# Patient Record
Sex: Female | Born: 1985 | Race: Black or African American | Hispanic: No | Marital: Single | Smoking: Current some day smoker
Health system: Southern US, Community
[De-identification: ages and names within clinical notes are randomized; demographics above are authoritative.]

## PROBLEM LIST (undated history)

## (undated) HISTORY — PX: OTHER SURGICAL HISTORY: SHX169

---
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
## 2005-05-10 ENCOUNTER — Emergency Department: Payer: Self-pay | Admitting: Emergency Medicine

## 2006-04-29 ENCOUNTER — Emergency Department: Payer: Self-pay | Admitting: Unknown Physician Specialty

## 2007-08-27 ENCOUNTER — Ambulatory Visit: Payer: Self-pay | Admitting: Family Medicine

## 2007-12-28 ENCOUNTER — Inpatient Hospital Stay: Payer: Self-pay

## 2008-02-05 ENCOUNTER — Emergency Department (HOSPITAL_COMMUNITY): Admission: EM | Admit: 2008-02-05 | Discharge: 2008-02-05 | Payer: Self-pay | Admitting: Emergency Medicine

## 2008-02-06 ENCOUNTER — Emergency Department (HOSPITAL_COMMUNITY): Admission: EM | Admit: 2008-02-06 | Discharge: 2008-02-06 | Payer: Self-pay | Admitting: *Deleted

## 2008-02-16 ENCOUNTER — Emergency Department (HOSPITAL_COMMUNITY): Admission: EM | Admit: 2008-02-16 | Discharge: 2008-02-16 | Payer: Self-pay | Admitting: Emergency Medicine

## 2008-10-22 ENCOUNTER — Inpatient Hospital Stay: Payer: Self-pay | Admitting: Obstetrics and Gynecology

## 2010-05-28 ENCOUNTER — Emergency Department: Payer: Self-pay | Admitting: Emergency Medicine

## 2010-06-08 ENCOUNTER — Encounter: Payer: Self-pay | Admitting: Maternal & Fetal Medicine

## 2010-07-13 ENCOUNTER — Encounter: Payer: Self-pay | Admitting: Obstetrics and Gynecology

## 2010-07-20 ENCOUNTER — Encounter: Payer: Self-pay | Admitting: Obstetrics and Gynecology

## 2010-07-27 ENCOUNTER — Encounter: Payer: Self-pay | Admitting: Obstetrics & Gynecology

## 2010-07-29 ENCOUNTER — Inpatient Hospital Stay: Payer: Self-pay

## 2011-03-04 ENCOUNTER — Emergency Department: Payer: Self-pay | Admitting: Unknown Physician Specialty

## 2011-10-06 ENCOUNTER — Emergency Department: Payer: Self-pay | Admitting: *Deleted

## 2011-10-06 LAB — URINALYSIS, COMPLETE
Glucose,UR: NEGATIVE mg/dL (ref 0–75)
Ketone: NEGATIVE
Nitrite: NEGATIVE
Specific Gravity: 1.025 (ref 1.003–1.030)
Squamous Epithelial: 11
WBC UR: 1292 /HPF (ref 0–5)

## 2011-11-29 ENCOUNTER — Emergency Department: Payer: Self-pay | Admitting: Emergency Medicine

## 2011-11-30 LAB — HCG, QUANTITATIVE, PREGNANCY: Beta Hcg, Quant.: 10959 m[IU]/mL — ABNORMAL HIGH

## 2011-12-27 ENCOUNTER — Encounter: Payer: Self-pay | Admitting: Obstetrics and Gynecology

## 2012-01-14 ENCOUNTER — Encounter: Payer: Self-pay | Admitting: Maternal & Fetal Medicine

## 2012-03-03 ENCOUNTER — Encounter: Payer: Self-pay | Admitting: Maternal & Fetal Medicine

## 2012-03-12 ENCOUNTER — Ambulatory Visit: Payer: Self-pay | Admitting: Obstetrics and Gynecology

## 2012-03-12 LAB — CBC WITH DIFFERENTIAL/PLATELET
Basophil #: 0.1 10*3/uL (ref 0.0–0.1)
Basophil %: 0.4 %
Eosinophil #: 0.2 10*3/uL (ref 0.0–0.7)
HCT: 35 % (ref 35.0–47.0)
Lymphocyte %: 19.2 %
MCH: 27 pg (ref 26.0–34.0)
MCHC: 32.3 g/dL (ref 32.0–36.0)
MCV: 84 fL (ref 80–100)
Monocyte #: 0.4 x10 3/mm (ref 0.2–0.9)
Neutrophil #: 10.4 10*3/uL — ABNORMAL HIGH (ref 1.4–6.5)
RDW: 13.4 % (ref 11.5–14.5)
WBC: 13.6 10*3/uL — ABNORMAL HIGH (ref 3.6–11.0)

## 2012-03-13 ENCOUNTER — Inpatient Hospital Stay: Payer: Self-pay | Admitting: Obstetrics and Gynecology

## 2012-03-16 LAB — PLATELET COUNT: Platelet: 271 10*3/uL (ref 150–440)

## 2012-05-22 ENCOUNTER — Emergency Department: Payer: Self-pay | Admitting: Emergency Medicine

## 2013-04-24 ENCOUNTER — Emergency Department: Payer: Self-pay | Admitting: Internal Medicine

## 2013-04-25 LAB — URINALYSIS, COMPLETE
Bacteria: NONE SEEN
Ketone: NEGATIVE
Leukocyte Esterase: NEGATIVE
Nitrite: NEGATIVE
Ph: 5 (ref 4.5–8.0)
Protein: NEGATIVE

## 2013-05-07 ENCOUNTER — Emergency Department: Payer: Self-pay | Admitting: Emergency Medicine

## 2013-05-07 LAB — COMPREHENSIVE METABOLIC PANEL
Bilirubin,Total: 0.2 mg/dL (ref 0.2–1.0)
Calcium, Total: 9 mg/dL (ref 8.5–10.1)
Co2: 29 mmol/L (ref 21–32)
Creatinine: 0.82 mg/dL (ref 0.60–1.30)
EGFR (Non-African Amer.): >60
Glucose: 104 mg/dL — ABNORMAL HIGH (ref 65–99)
SGOT(AST): 19 U/L (ref 15–37)
Sodium: 137 mmol/L (ref 136–145)

## 2013-05-07 LAB — CBC WITH DIFFERENTIAL/PLATELET
Basophil #: 0.1 10*3/uL (ref 0.0–0.1)
Eosinophil #: 0.2 10*3/uL (ref 0.0–0.7)
HGB: 12.6 g/dL (ref 12.0–16.0)
Lymphocyte #: 3.2 10*3/uL (ref 1.0–3.6)
Lymphocyte %: 30.4 %
MCH: 27.4 pg (ref 26.0–34.0)
MCV: 84 fL (ref 80–100)
Monocyte %: 4.3 %
Neutrophil #: 6.6 10*3/uL — ABNORMAL HIGH (ref 1.4–6.5)
Neutrophil %: 62.5 %
RBC: 4.59 10*6/uL (ref 3.80–5.20)
RDW: 14.9 % — ABNORMAL HIGH (ref 11.5–14.5)
WBC: 10.5 10*3/uL (ref 3.6–11.0)

## 2013-05-07 LAB — URINALYSIS, COMPLETE
Bacteria: NONE SEEN
Glucose,UR: NEGATIVE mg/dL (ref 0–75)
Ketone: NEGATIVE
Protein: 30
Specific Gravity: 1.023 (ref 1.003–1.030)
Squamous Epithelial: 8

## 2013-10-02 ENCOUNTER — Emergency Department: Payer: Self-pay | Admitting: Emergency Medicine

## 2013-10-04 LAB — BETA STREP CULTURE(ARMC)

## 2014-01-12 ENCOUNTER — Emergency Department: Payer: Self-pay | Admitting: Emergency Medicine

## 2014-02-22 ENCOUNTER — Emergency Department: Payer: Self-pay | Admitting: Emergency Medicine

## 2014-06-23 ENCOUNTER — Emergency Department: Payer: Self-pay | Admitting: Emergency Medicine

## 2014-09-07 NOTE — Op Note (Signed)
PATIENT NAME:  Stephanie Ballard, Stephanie Ballard MR#:  [id number] DATE OF BIRTH:  Aug 24, 1985  DATE OF PROCEDURE:  03/13/2012  PREOPERATIVE DIAGNOSES:  1. Term intrauterine pregnancy at 38 weeks, 5 days gestation by poor dating via 23 week ultrasound.  2. Undesired fertility.  3. History of prior classical Cesarean section.   POSTOPERATIVE DIAGNOSES:  1. Term intrauterine pregnancy at 38 weeks, 5 days gestation by poor dating via 23 week ultrasound.  2. Undesired fertility.  3. History of prior classical Cesarean section.   OPERATION PERFORMED:  1. Low transverse Cesarean section via Pfannenstiel skin incision.  2. Bilateral tubal ligation via Pomeroy method.   ANESTHESIA USED: Spinal.   PRIMARY SURGEON: Florina Ou. Bonney Aid, MD    ASSISTANT: Senaida Lange, MD   ESTIMATED BLOOD LOSS: 800 mL.  OPERATIVE FLUIDS: 800 mL of Crystalloid.   URINE OUTPUT: 100 mL.  DRAINS OR TUBES: On-Q catheter system and Wound VAC.   IMPLANTS: None.   SPECIMENS REMOVED: None. Bilateral portions of right and left tube.   PREOPERATIVE ANTIBIOTIC: 3 grams of Ancef.   COMPLICATIONS: None.   INTRAOPERATIVE FINDINGS: Normal uterus, ovaries, and tubes. Moderate amount of adhesive disease involving the omentum and the prior classical Cesarean section scar. Liveborn female infant weighing 2900 grams, Apgars 9 and 9.   PATIENT CONDITION FOLLOWING PROCEDURE: Stable.   PROCEDURE IN DETAIL: Risks, benefits, and alternatives of the procedure were discussed with the patient prior to proceeding to the operating room. The patient was taken to the operating room where spinal anesthesia was administered. She was positioned in the supine position. The pannus was taped cephalad using tensoplast tape. The abdomen was then prepped and draped in the usual sterile fashion. A Pfannenstiel skin incision was made on the skin utilizing the patient's pre-existing scar. This was carried down sharply to the level of the rectus fascia which was  incised in the midline using the knife. The fascial incision was extended using Mayo scissors. The superior border of the rectus fascia was grasped with two Kocher clamps. The underlying rectus muscles were bluntly dissected off the rectus fascia and the median raphe incised using Mayo scissors. The inferior border of the rectus fascia was dissected off the rectus muscle in a similar fashion. The rectus muscles were divided in the midline. A hemostat was used to tent up the peritoneum which was then incised using Metzenbaum scissors. Peritoneal incision was extended using manual traction. There was a thick omental adhesion to the prior classical hysterotomy scar. This adhesion was taken with two Kelly clamps and doubly ligated using free ties. Bladder flap was unable to be created. Hysterotomy incision was made low transverse on the uterus. This was then entered bluntly using the operator's finger. The hysterotomy incision was extended using manual traction. Upon placing the operator's hand into the hysterotomy incision, infant was noted to be in OA position. Vertex was grasped, flexed, brought to the incision, and delivered atraumatically using fundal pressure. Infant was suctioned. Cord was clamped and cut and the infant was passed to the awaiting pediatrician. Cord blood was obtained. The hysterotomy incision was closed using a single layer closure of 0 Vicryl.   Attention was then turned to the right tube which was ligated using a Pomeroy method. It was doubly ligated using a 0 chromic wheal. The intervening knuckle of tube was then excised. This was repeated on the patient's left tube. Both tubes were noted to be hemostatic as was the hysterotomy incision.   The pelvis was irrigated.  The uterus was returned to the abdomen. Hysterotomy incision was once again inspected and noted to be hemostatic. Muscle was closed using a single mattress suture of 2-0 Vicryl. The On-Q catheter system was then placed 4 cm  superior to the Pfannenstiel skin incision in the midline. The On-Q catheters were threaded through the introducers and introducers were removed. Fascia was closed using a looped #1 PDS in a running fashion. Subcutaneous tissue was irrigated. Hemostasis was achieved using the Bovie. The subcutaneous dead space was closed using a 2-0 chromic on a large needle. The skin was closed using staples. The On-Q catheters were dressed with Dermabond, Steri-Strips, and Tegaderm. Each On-Q catheter was flushed with 5 mL 0.5% bupivacaine each. The Wound VAC was then applied across the incision. Sponge, needle, and instrument counts were correct x2. The patient tolerated the procedure well and was taken to the recovery room in stable condition.    ____________________________ Florina OuAndreas M. Bonney AidStaebler, MD ams:drc D: 03/16/2012 22:32:00 ET T: 03/17/2012 08:49:58 ET JOB#: [id number]  cc: Florina OuAndreas M. Bonney AidStaebler, MD, <Dictator> Lorrene ReidANDREAS M Lyllie Cobbins MD ELECTRONICALLY SIGNED 03/20/2012 23:32

## 2015-10-11 ENCOUNTER — Encounter: Payer: Self-pay | Admitting: Emergency Medicine

## 2015-10-11 ENCOUNTER — Emergency Department
Admission: EM | Admit: 2015-10-11 | Discharge: 2015-10-11 | Disposition: A | Discharge status: Home / Self Care | Payer: Medicaid Other | Attending: Emergency Medicine | Admitting: Emergency Medicine

## 2015-10-11 DIAGNOSIS — N390 Urinary tract infection, site not specified: Secondary | ICD-10-CM | POA: Diagnosis not present

## 2015-10-11 DIAGNOSIS — F17200 Nicotine dependence, unspecified, uncomplicated: Secondary | ICD-10-CM | POA: Diagnosis not present

## 2015-10-11 DIAGNOSIS — R300 Dysuria: Secondary | ICD-10-CM | POA: Diagnosis present

## 2015-10-11 LAB — URINALYSIS COMPLETE WITH MICROSCOPIC (ARMC ONLY)
BACTERIA UA: NONE SEEN
Bilirubin Urine: NEGATIVE
GLUCOSE, UA: NEGATIVE mg/dL
Ketones, ur: NEGATIVE mg/dL
Nitrite: NEGATIVE
Protein, ur: 30 mg/dL — AB
Specific Gravity, Urine: 1.023 (ref 1.005–1.030)
pH: 6 (ref 5.0–8.0)

## 2015-10-11 LAB — GLUCOSE, CAPILLARY: GLUCOSE-CAPILLARY: 119 mg/dL — AB (ref 65–99)

## 2015-10-11 LAB — POCT PREGNANCY, URINE: PREG TEST UR: NEGATIVE

## 2015-10-11 MED ORDER — PHENAZOPYRIDINE HCL 100 MG PO TABS: 100.0000 mg | ORAL_TABLET | Freq: Three times a day (TID) | ORAL | Status: DC | PRN

## 2015-10-11 MED ORDER — SULFAMETHOXAZOLE-TRIMETHOPRIM 800-160 MG PO TABS: 1.0000 | ORAL_TABLET | Freq: Two times a day (BID) | ORAL | Status: DC

## 2015-10-11 NOTE — ED Notes (Signed)
CBG 119 

## 2015-10-11 NOTE — ED Notes (Signed)
Patient ambulatory to triage with steady gait, without difficulty or distress noted; pt reports dysuria for last few days; denies abd or back pain; denies any current pain

## 2015-10-11 NOTE — Discharge Instructions (Signed)

## 2015-10-11 NOTE — ED Provider Notes (Signed)
[facility] Emergency Department Provider Note  ____________________________________________  Time seen: Approximately 7:45 PM  I have reviewed the triage vital signs and the nursing notes.   HISTORY  Chief Complaint Dysuria    HPI Stephanie Ballard is a 30 y.o. female , NAD, presents to the emergency department with several day history of increased urinary frequency and dysuria. Also notes decreased volume of urine at times with urinary urgency. Has had some mild fatigue and states her mother is concerned her blood glucose may be elevated. Patient states she has no history of hyperglycemia but has not checked her sugars and some time. Does have family history of diabetes. Notes that she drinks mainly sodas and sweet tea instead of water. Denies any abdominal pain, lower back pain, nausea, vomiting nor diarrhea. No fevers, chills, body aches. Denies polydipsia.   History reviewed. No pertinent past medical history.  There are no active problems to display for this patient.   History reviewed. No pertinent past surgical history.  Current Outpatient Rx  Name  Route  Sig  Dispense  Refill  . phenazopyridine (PYRIDIUM) 100 MG tablet   Oral   Take 1 tablet (100 mg total) by mouth 3 (three) times daily as needed for pain (May take 1-2 as needed three times daily).   9 tablet   0   . sulfamethoxazole-trimethoprim (BACTRIM DS,SEPTRA DS) 800-160 MG tablet   Oral   Take 1 tablet by mouth 2 (two) times daily.   10 tablet   0     Allergies Review of patient's allergies indicates no known allergies.  No family history on file.  Social History Social History  Substance Use Topics  . Smoking status: Current Some Day Smoker  . Smokeless tobacco: None  . Alcohol Use: No     Review of Systems  Constitutional: Positive fatigue. No fever/chills Eyes: No visual changes. Cardiovascular: No chest pain. Respiratory: No shortness of breath. No wheezing.   Gastrointestinal: No abdominal pain.  No nausea, vomiting.  No diarrhea.   Genitourinary: Positive for dysuria, urinary hesitancy, urgency and increased frequency. No hematuria, vaginal discharge, pelvic pain.  Musculoskeletal: Negative for back pain.  Skin: Negative for rash, skin sores. Endocrine: No polydipsia. Neurological: Negative for headaches, focal weakness or numbness. No tingling 10-point ROS otherwise negative.  ____________________________________________   PHYSICAL EXAM:  VITAL SIGNS: ED Triage Vitals  Enc Vitals Group     BP 10/11/15 1940 146/76 mmHg     Pulse Rate 10/11/15 1940 106     Resp 10/11/15 1940 20     Temp 10/11/15 1940 98.1 F (36.7 C)     Temp Source 10/11/15 1940 Oral     SpO2 10/11/15 1940 99 %     Weight 10/11/15 1940 260 lb (117.935 kg)     Height 10/11/15 1940 5' (1.524 m)     Head Cir --      Peak Flow --      Pain Score --      Pain Loc --      Pain Edu? --      Excl. in GC? --      Constitutional: Alert and oriented. Well appearing and in no acute distress.Obese. Eyes: Conjunctivae are normal.  Head: Atraumatic. Neck: Supple with full range of motion Hematological/Lymphatic/Immunilogical: No cervical lymphadenopathy. Cardiovascular: Normal rate, regular rhythm. Normal S1 and S2.  Good peripheral circulation with 2+ pulses noted in the upper extremities. Respiratory: Normal respiratory effort without tachypnea or retractions.  Lungs CTAB with breath sounds noted in all lung fields. Gastrointestinal: Soft and nontender without distention or guarding in all quadrants. Grossly normal bowel sounds. No CVA tenderness. Neurologic:  Normal speech and language. Gait and posture are normal.  Skin:  Skin is warm, dry and intact. No rash noted. Psychiatric: Mood and affect are normal. Speech and behavior are normal. Patient exhibits appropriate insight and judgement.   ____________________________________________   LABS (all labs ordered are  listed, but only abnormal results are displayed)  Labs Reviewed  URINALYSIS COMPLETEWITH MICROSCOPIC ([facility] ONLY) - Abnormal; Notable for the following:    Color, Urine YELLOW (*)    APPearance HAZY (*)    Hgb urine dipstick 1+ (*)    Protein, ur 30 (*)    Leukocytes, UA 1+ (*)    Squamous Epithelial / LPF 6-30 (*)    All other components within normal limits  GLUCOSE, CAPILLARY - Abnormal; Notable for the following:    Glucose-Capillary 119 (*)    All other components within normal limits  URINE CULTURE  POCT PREGNANCY, URINE  CBG MONITORING, ED   ____________________________________________  EKG  None ____________________________________________  RADIOLOGY  None ____________________________________________    PROCEDURES  Procedure(s) performed: None   Medications - No data to display   ____________________________________________   INITIAL IMPRESSION / ASSESSMENT AND PLAN / ED COURSE  Pertinent lab results that were available during my care of the patient were reviewed by me and considered in my medical decision making (see chart for details).  Patient's diagnosis is consistent with Lower urinary tract infection. Patient will be discharged home with prescriptions for Bactrim DS and Pyridium to take as directed. [facility] if symptoms persist past this treatment course. Patient is given ED precautions to return to the ED for any worsening or new symptoms.      ____________________________________________  FINAL CLINICAL IMPRESSION(S) / ED DIAGNOSES  Final diagnoses:  Lower urinary tract infection      NEW MEDICATIONS STARTED DURING THIS VISIT:  Discharge Medication List as of 10/11/2015  8:42 PM    START taking these medications   Details  phenazopyridine (PYRIDIUM) 100 MG tablet Take 1 tablet (100 mg total) by mouth 3 (three) times daily as needed for pain (May take 1-2 as needed three times daily)., Starting  10/11/2015, Until Discontinued, Print    sulfamethoxazole-trimethoprim (BACTRIM DS,SEPTRA DS) 800-160 MG tablet Take 1 tablet by mouth 2 (two) times daily., Starting 10/11/2015, Until Discontinued, Print             Hope PigeonJami L Airen Stiehl, PA-C 10/11/15 2054  Rockne MenghiniAnne-Caroline Norman, MD 10/12/15 [phone]

## 2015-10-12 LAB — URINE CULTURE: SPECIAL REQUESTS: NORMAL

## 2016-05-26 ENCOUNTER — Encounter: Payer: Self-pay | Admitting: Emergency Medicine

## 2016-05-26 ENCOUNTER — Emergency Department
Admission: EM | Admit: 2016-05-26 | Discharge: 2016-05-26 | Disposition: A | Discharge status: Home / Self Care | Payer: Medicaid Other | Attending: Emergency Medicine | Admitting: Emergency Medicine

## 2016-05-26 DIAGNOSIS — K0889 Other specified disorders of teeth and supporting structures: Secondary | ICD-10-CM | POA: Diagnosis present

## 2016-05-26 DIAGNOSIS — K029 Dental caries, unspecified: Secondary | ICD-10-CM | POA: Diagnosis not present

## 2016-05-26 MED ORDER — IBUPROFEN 600 MG PO TABS: 600.0000 mg | ORAL_TABLET | Freq: Four times a day (QID) | ORAL | 0 refills | Status: DC | PRN

## 2016-05-26 MED ORDER — TRAMADOL HCL 50 MG PO TABS: 50.0000 mg | ORAL_TABLET | Freq: Four times a day (QID) | ORAL | 0 refills | Status: DC | PRN

## 2016-05-26 MED ORDER — IBUPROFEN 600 MG PO TABS
600.0000 mg | ORAL_TABLET | Freq: Once | ORAL | Status: AC
  Administered 2016-05-26: 600 mg via ORAL
  Filled 2016-05-26: qty 1

## 2016-05-26 MED ORDER — AMOXICILLIN 500 MG PO CAPS: 500.0000 mg | ORAL_CAPSULE | Freq: Three times a day (TID) | ORAL | 0 refills | Status: DC

## 2016-05-26 MED ORDER — LIDOCAINE VISCOUS 2 % MT SOLN
15.0000 mL | Freq: Once | OROMUCOSAL | Status: AC
  Administered 2016-05-26: 15 mL via OROMUCOSAL
  Filled 2016-05-26: qty 15

## 2016-05-26 MED ORDER — TRAMADOL HCL 50 MG PO TABS
50.0000 mg | ORAL_TABLET | Freq: Once | ORAL | Status: AC
  Administered 2016-05-26: 50 mg via ORAL
  Filled 2016-05-26: qty 1

## 2016-05-26 MED ORDER — LIDOCAINE VISCOUS 2 % MT SOLN: 5.0000 mL | Freq: Four times a day (QID) | OROMUCOSAL | 0 refills | Status: DC | PRN

## 2016-05-26 NOTE — Discharge Instructions (Signed)
May follow up with list of dental clinics provided OPTIONS FOR DENTAL FOLLOW UP CARE  [place] Department of Health and Human Services - Local Safety Net Dental Clinics [email or web address]://www.ncdhhs.gov/dph/oralhealth/services/safetynetclinics.htm   [facility] [phone])  [facility] [place] [phone])  [facility] [place] [phone]  [facility] [phone]  [facility] [phone]) This clinic caters to the indigent population and is on a lottery system. Location: [employer] [address] [facility], [address], [place] [facility] Hours: Wednesdays from 6pm - 9pm, patients seen by a lottery system. For dates, call or go to [email or web address] Services: Cleanings, fillings and simple extractions. Payment Options: DENTAL WORK IS FREE OF CHARGE. Bring proof of income or support. Best way to get seen: Arrive at 5:15 pm - this is a lottery, NOT first come/first serve, so arriving earlier will not increase your chances of being seen.     [facility] [phone] Select option 1 for emergencies   Location: [facility], [facility], [address], [place] [facility] Hours: No walk-ins accepted - call the day before to schedule an appointment. Check in times are 9:30 am and 1:30 pm. Services: Simple extractions, temporary fillings, pulpectomy/pulp debridement, uncomplicated abscess drainage. Payment Options: PAYMENT IS DUE AT THE TIME OF SERVICE.  Fee is usually $100-200, additional surgical procedures (e.g. abscess drainage) may be extra. Cash, checks, Visa/MasterCard accepted.  Can file Medicaid if patient is covered for dental - patient should call case worker to check. No discount for [facility] patients. Best way to get seen: MUST call the day before and get onto the schedule. Can usually be seen the next 1-2 days. No walk-ins accepted.      [facility] [phone]   Location: [facility], [address], [place] [facility] Hours: M, W, Th, F 8am or 1:30pm, Tues 9a or 1:30 - first come/first served. Services: Simple extractions, temporary fillings, uncomplicated abscess drainage.  You do not need to be an [place] resident. Payment Options: PAYMENT IS DUE AT THE TIME OF SERVICE. Dental insurance, otherwise sliding scale - bring proof of income or support. Depending on income and treatment needed, cost is usually $50-200. Best way to get seen: Arrive early as it is first come/first served.     [facility] [phone]   Location: [address] Clinic Hours: Mon-Thu 8a-5p Services: Most basic dental services including extractions and fillings. Payment Options: PAYMENT IS DUE AT THE TIME OF SERVICE. Sliding scale, up to 50% off - bring proof if income or support. Medicaid with dental option accepted. Best way to get seen: Call to schedule an appointment, can usually be seen within 2 weeks OR they will try to see walk-ins - show up at 8a or 2p (you may have to wait).     [facility] [phone] [place] RESIDENTS ONLY   Location: [facility], [address], [place], [place] [id number] Clinic Hours: By appointment only. Monday - Thursday 8am-5pm, Friday 8am-12pm Services: Cleanings, fillings, extractions. Payment Options: PAYMENT IS DUE AT THE TIME OF SERVICE. Cash, Visa or MasterCard. Sliding scale - $30 minimum per service. Best way to get seen: Come in to office, complete packet and make an appointment - need proof of income or support monies for each household member and proof of [facility] [place] residence. Usually takes about a month to get in.     [facility] [phone]   Location: [address], [place] [facility] Hours: Walk-in Urgent Care  Dental Services are offered Monday-Friday mornings only. The numbers of emergencies accepted daily is limited to the number of providers available. Maximum 15 - Mondays, Wednesdays & Thursdays Maximum 10 - Tuesdays & Fridays Services: You do not need to be a [facility] resident to be seen for a dental emergency. Emergencies are defined as pain, swelling, abnormal bleeding, or dental trauma. Walkins will receive x-rays if needed. NOTE: Dental cleaning is not an emergency. Payment Options: PAYMENT IS DUE AT THE TIME OF SERVICE. Minimum co-pay is $40.00 for uninsured patients. Minimum co-pay is $3.00 for Medicaid with dental coverage. Dental Insurance is accepted and must be presented at time of visit. Medicare does not cover dental. Forms of payment: Cash, credit card, checks. Best way to get seen: If not previously registered with the clinic, walk-in dental registration begins at 7:15 am and is on a first come/first serve basis. If previously registered with the clinic, call to make an appointment.     [facility] [place] ONLY   Location: [address], [place], [place] Clinic Hours: Mon-Thu 10a-2p Services: Extractions only! Payment Options: FREE (donations accepted) - bring proof of income or support Best way to get seen: Call and schedule an appointment OR come at 8am on the 1st Monday of every month (except for holidays) when it is first come/first served.     [facility] [phone]   Location: [place], [place] Clinic Hours: Friday mornings Services, Payment Options, Best way to get seen: Call for info

## 2016-05-26 NOTE — ED Provider Notes (Signed)
[facility] Emergency Department Provider Note   ____________________________________________   First MD Initiated Contact with Patient 05/26/16 [phone]     (approximate)  I have reviewed the triage vital signs and the nursing notes.   HISTORY  Chief Complaint Dental Pain    HPI Stephanie Ballard is a 31 y.o. female patient complaining of 2 days of dental pain. Patient stated pain is located on the right upper and lower molars. Patient rated the pain as a 10 over 10. No palliative measures for this complaint. Patient state she has been referred to a dentist but will not see him until next week.   History reviewed. No pertinent past medical history.  There are no active problems to display for this patient.   History reviewed. No pertinent surgical history.  Prior to Admission medications   Medication Sig Start Date End Date Taking? Authorizing Provider  amoxicillin (AMOXIL) 500 MG capsule Take 1 capsule (500 mg total) by mouth 3 (three) times daily. 05/26/16   Joni Reiningonald K Makynzee Tigges, PA-C  ibuprofen (ADVIL,MOTRIN) 600 MG tablet Take 1 tablet (600 mg total) by mouth every 6 (six) hours as needed. 05/26/16   Joni Reiningonald K Couper Juncaj, PA-C  lidocaine (XYLOCAINE) 2 % solution Use as directed 5 mLs in the mouth or throat every 6 (six) hours as needed for mouth pain. 05/26/16   Joni Reiningonald K Zira Helinski, PA-C  phenazopyridine (PYRIDIUM) 100 MG tablet Take 1 tablet (100 mg total) by mouth 3 (three) times daily as needed for pain (May take 1-2 as needed three times daily). 10/11/15   Jami L Hagler, PA-C  sulfamethoxazole-trimethoprim (BACTRIM DS,SEPTRA DS) 800-160 MG tablet Take 1 tablet by mouth 2 (two) times daily. 10/11/15   Jami L Hagler, PA-C  traMADol (ULTRAM) 50 MG tablet Take 1 tablet (50 mg total) by mouth every 6 (six) hours as needed. 05/26/16 05/26/17  Joni Reiningonald K Arkie Tagliaferro, PA-C    Allergies Patient has no known allergies.  History reviewed. No pertinent family history.  Social History Social  History  Substance Use Topics  . Smoking status: Current Some Day Smoker    Types: Cigarettes  . Smokeless tobacco: Never Used  . Alcohol use Yes     Comment: once/week    Review of Systems Constitutional: No fever/chills Eyes: No visual changes. ENT: Dental pain Cardiovascular: Denies chest pain. Respiratory: Denies shortness of breath. Gastrointestinal: No abdominal pain.  No nausea, no vomiting.  No diarrhea.  No constipation. Genitourinary: Negative for dysuria. Musculoskeletal: Negative for back pain. Skin: Negative for rash. Neurological: Negative for headaches, focal weakness or numbness. 10-point ROS otherwise negative.  ____________________________________________   PHYSICAL EXAM:  VITAL SIGNS: ED Triage Vitals  Enc Vitals Group     BP 05/26/16 0806 (!) 155/82     Pulse Rate 05/26/16 0806 98     Resp 05/26/16 0806 20     Temp 05/26/16 0806 98.3 F (36.8 C)     Temp Source 05/26/16 0806 Oral     SpO2 05/26/16 0806 97 %     Weight 05/26/16 0807 280 lb (127 kg)     Height 05/26/16 0807 5' (1.524 m)     Head Circumference --      Peak Flow --      Pain Score 05/26/16 0807 10     Pain Loc --      Pain Edu? --      Excl. in GC? --     Constitutional: Alert and oriented. Well appearing and  in no acute distress. Eyes: Conjunctivae are normal. PERRL. EOMI. Head: Atraumatic. Nose: No congestion/rhinnorhea. Mouth/Throat: Mucous membranes are moist.  Oropharynx non-erythematous.Multiple dental caries Neck: No stridor.  No cervical spine tenderness to palpation. Hematological/Lymphatic/Immunilogical: No cervical lymphadenopathy. Cardiovascular: Normal rate, regular rhythm. Grossly normal heart sounds.  Good peripheral circulation. Respiratory: Normal respiratory effort.  No retractions. Lungs CTAB. Gastrointestinal: Soft and nontender. No distention. No abdominal bruits. No CVA tenderness. Musculoskeletal: No lower extremity tenderness nor edema.  No joint  effusions. Neurologic:  Normal speech and language. No gross focal neurologic deficits are appreciated. No gait instability. Skin:  Skin is warm, dry and intact. No rash noted. Psychiatric: Mood and affect are normal. Speech and behavior are normal.  ____________________________________________   LABS (all labs ordered are listed, but only abnormal results are displayed)  Labs Reviewed - No data to display ____________________________________________  EKG   ____________________________________________  RADIOLOGY   ____________________________________________   PROCEDURES  Procedure(s) performed:   Procedures  Critical Care performed: No  ____________________________________________   INITIAL IMPRESSION / ASSESSMENT AND PLAN / ED COURSE  Pertinent labs & imaging results that were available during my care of the patient were reviewed by me and considered in my medical decision making (see chart for details).  Dental pain. Multiple caries. Patient given discharge care instructions. Patient given prescription for tramadol, amoxicillin, ibuprofen, and viscous lidocaine. Patient advised follow-up with her treating dentist.  Clinical Course      ____________________________________________   FINAL CLINICAL IMPRESSION(S) / ED DIAGNOSES  Final diagnoses:  Pain, dental  Dental caries      NEW MEDICATIONS STARTED DURING THIS VISIT:  New Prescriptions   AMOXICILLIN (AMOXIL) 500 MG CAPSULE    Take 1 capsule (500 mg total) by mouth 3 (three) times daily.   IBUPROFEN (ADVIL,MOTRIN) 600 MG TABLET    Take 1 tablet (600 mg total) by mouth every 6 (six) hours as needed.   LIDOCAINE (XYLOCAINE) 2 % SOLUTION    Use as directed 5 mLs in the mouth or throat every 6 (six) hours as needed for mouth pain.   TRAMADOL (ULTRAM) 50 MG TABLET    Take 1 tablet (50 mg total) by mouth every 6 (six) hours as needed.     Note:  This document was prepared using Dragon voice recognition  software and may include unintentional dictation errors.    Joni Reining, PA-C 05/26/16 1610    Phineas Semen, MD 05/26/16 [phone]

## 2016-05-26 NOTE — ED Triage Notes (Signed)
Pt c/o pain and swelling R upper and lower dental pain.

## 2016-06-05 ENCOUNTER — Encounter: Payer: Self-pay | Admitting: Emergency Medicine

## 2016-06-05 ENCOUNTER — Inpatient Hospital Stay
Admission: EM | Admit: 2016-06-05 | Discharge: 2016-06-07 | DRG: 158 | Disposition: A | Discharge status: Home / Self Care | Payer: Medicaid Other | Attending: Internal Medicine | Admitting: Internal Medicine

## 2016-06-05 ENCOUNTER — Emergency Department: Payer: Medicaid Other

## 2016-06-05 DIAGNOSIS — K047 Periapical abscess without sinus: Secondary | ICD-10-CM | POA: Diagnosis present

## 2016-06-05 DIAGNOSIS — L03211 Cellulitis of face: Secondary | ICD-10-CM | POA: Diagnosis present

## 2016-06-05 DIAGNOSIS — F17200 Nicotine dependence, unspecified, uncomplicated: Secondary | ICD-10-CM | POA: Diagnosis present

## 2016-06-05 DIAGNOSIS — K0889 Other specified disorders of teeth and supporting structures: Secondary | ICD-10-CM | POA: Diagnosis present

## 2016-06-05 DIAGNOSIS — L0201 Cutaneous abscess of face: Secondary | ICD-10-CM | POA: Diagnosis present

## 2016-06-05 DIAGNOSIS — Z7901 Long term (current) use of anticoagulants: Secondary | ICD-10-CM | POA: Diagnosis not present

## 2016-06-05 DIAGNOSIS — E876 Hypokalemia: Secondary | ICD-10-CM | POA: Diagnosis present

## 2016-06-05 DIAGNOSIS — T380X5A Adverse effect of glucocorticoids and synthetic analogues, initial encounter: Secondary | ICD-10-CM | POA: Diagnosis present

## 2016-06-05 DIAGNOSIS — Z6841 Body Mass Index (BMI) 40.0 and over, adult: Secondary | ICD-10-CM | POA: Diagnosis not present

## 2016-06-05 LAB — BASIC METABOLIC PANEL
ANION GAP: 8 (ref 5–15)
BUN: 8 mg/dL (ref 6–20)
CALCIUM: 8.5 mg/dL — AB (ref 8.9–10.3)
CO2: 28 mmol/L (ref 22–32)
CREATININE: 0.81 mg/dL (ref 0.44–1.00)
Chloride: 105 mmol/L (ref 101–111)
Glucose, Bld: 103 mg/dL — ABNORMAL HIGH (ref 65–99)
Potassium: 2.8 mmol/L — ABNORMAL LOW (ref 3.5–5.1)
SODIUM: 141 mmol/L (ref 135–145)

## 2016-06-05 LAB — CBC
HEMATOCRIT: 37 % (ref 35.0–47.0)
Hemoglobin: 12.4 g/dL (ref 12.0–16.0)
MCH: 28.7 pg (ref 26.0–34.0)
MCHC: 33.6 g/dL (ref 32.0–36.0)
MCV: 85.4 fL (ref 80.0–100.0)
Platelets: 343 10*3/uL (ref 150–440)
RBC: 4.33 MIL/uL (ref 3.80–5.20)
RDW: 14 % (ref 11.5–14.5)
WBC: 14.7 10*3/uL — ABNORMAL HIGH (ref 3.6–11.0)

## 2016-06-05 LAB — MAGNESIUM: Magnesium: 1.7 mg/dL (ref 1.7–2.4)

## 2016-06-05 LAB — POTASSIUM: Potassium: 3.8 mmol/L (ref 3.5–5.1)

## 2016-06-05 LAB — POC URINE PREG, ED: Preg Test, Ur: NEGATIVE

## 2016-06-05 MED ORDER — ACETAMINOPHEN 650 MG RE SUPP
650.0000 mg | Freq: Four times a day (QID) | RECTAL | Status: DC | PRN
  Filled 2016-06-05: qty 1

## 2016-06-05 MED ORDER — MAGNESIUM SULFATE 2 GM/50ML IV SOLN
2.0000 g | Freq: Once | INTRAVENOUS | Status: AC
  Administered 2016-06-05: 2 g via INTRAVENOUS
  Filled 2016-06-05: qty 50

## 2016-06-05 MED ORDER — CLINDAMYCIN PHOSPHATE 600 MG/50ML IV SOLN
600.0000 mg | Freq: Once | INTRAVENOUS | Status: AC
  Administered 2016-06-05: 600 mg via INTRAVENOUS
  Filled 2016-06-05: qty 50

## 2016-06-05 MED ORDER — SODIUM CHLORIDE 0.9 % IV SOLN
30.0000 meq | Freq: Once | INTRAVENOUS | Status: AC
  Administered 2016-06-05: 30 meq via INTRAVENOUS
  Filled 2016-06-05: qty 15

## 2016-06-05 MED ORDER — KETOROLAC TROMETHAMINE 30 MG/ML IJ SOLN
30.0000 mg | Freq: Four times a day (QID) | INTRAMUSCULAR | Status: DC | PRN
  Administered 2016-06-06 – 2016-06-07 (×3): 30 mg via INTRAVENOUS
  Filled 2016-06-05 (×3): qty 1

## 2016-06-05 MED ORDER — ONDANSETRON HCL 4 MG PO TABS: 4.0000 mg | ORAL_TABLET | Freq: Four times a day (QID) | ORAL | Status: DC | PRN

## 2016-06-05 MED ORDER — DEXAMETHASONE SODIUM PHOSPHATE 10 MG/ML IJ SOLN
10.0000 mg | Freq: Once | INTRAMUSCULAR | Status: AC
  Administered 2016-06-05: 10 mg via INTRAVENOUS
  Filled 2016-06-05: qty 1

## 2016-06-05 MED ORDER — MORPHINE SULFATE (PF) 4 MG/ML IV SOLN
4.0000 mg | Freq: Once | INTRAVENOUS | Status: AC
  Administered 2016-06-05: 4 mg via INTRAVENOUS
  Filled 2016-06-05: qty 1

## 2016-06-05 MED ORDER — ZOLPIDEM TARTRATE 5 MG PO TABS
5.0000 mg | ORAL_TABLET | Freq: Every evening | ORAL | Status: DC | PRN
  Administered 2016-06-06 – 2016-06-07 (×2): 5 mg via ORAL
  Filled 2016-06-05 (×2): qty 1

## 2016-06-05 MED ORDER — IOPAMIDOL (ISOVUE-300) INJECTION 61%
75.0000 mL | Freq: Once | INTRAVENOUS | Status: AC | PRN
  Administered 2016-06-05: 75 mL via INTRAVENOUS

## 2016-06-05 MED ORDER — CLINDAMYCIN PHOSPHATE 600 MG/50ML IV SOLN
600.0000 mg | Freq: Three times a day (TID) | INTRAVENOUS | Status: DC
  Administered 2016-06-05 – 2016-06-07 (×6): 600 mg via INTRAVENOUS
  Filled 2016-06-05 (×7): qty 50

## 2016-06-05 MED ORDER — ACETAMINOPHEN 325 MG PO TABS: 650.0000 mg | ORAL_TABLET | Freq: Four times a day (QID) | ORAL | Status: DC | PRN

## 2016-06-05 MED ORDER — ENOXAPARIN SODIUM 40 MG/0.4ML ~~LOC~~ SOLN
40.0000 mg | Freq: Two times a day (BID) | SUBCUTANEOUS | Status: DC
  Administered 2016-06-05 – 2016-06-07 (×4): 40 mg via SUBCUTANEOUS
  Filled 2016-06-05 (×4): qty 0.4

## 2016-06-05 MED ORDER — HYDROMORPHONE HCL 1 MG/ML IJ SOLN
INTRAMUSCULAR | Status: AC
  Administered 2016-06-05: 0.5 mg via INTRAVENOUS
  Filled 2016-06-05: qty 1

## 2016-06-05 MED ORDER — ONDANSETRON HCL 4 MG/2ML IJ SOLN
4.0000 mg | Freq: Once | INTRAMUSCULAR | Status: AC
  Administered 2016-06-05: 4 mg via INTRAVENOUS
  Filled 2016-06-05: qty 2

## 2016-06-05 MED ORDER — POTASSIUM CHLORIDE IN NACL 20-0.9 MEQ/L-% IV SOLN
INTRAVENOUS | Status: DC
  Administered 2016-06-05 – 2016-06-07 (×3): via INTRAVENOUS
  Filled 2016-06-05 (×5): qty 1000

## 2016-06-05 MED ORDER — HYDROMORPHONE HCL 1 MG/ML IJ SOLN
0.5000 mg | Freq: Once | INTRAMUSCULAR | Status: AC
  Administered 2016-06-05: 0.5 mg via INTRAVENOUS

## 2016-06-05 MED ORDER — NICOTINE 7 MG/24HR TD PT24
7.0000 mg | MEDICATED_PATCH | Freq: Every day | TRANSDERMAL | Status: DC
  Administered 2016-06-05 – 2016-06-06 (×2): 7 mg via TRANSDERMAL
  Filled 2016-06-05 (×3): qty 1

## 2016-06-05 MED ORDER — DEXAMETHASONE SODIUM PHOSPHATE 10 MG/ML IJ SOLN
10.0000 mg | Freq: Two times a day (BID) | INTRAMUSCULAR | Status: DC
Start: 2016-06-05 — End: 2016-06-07
  Administered 2016-06-05 – 2016-06-06 (×3): 10 mg via INTRAVENOUS
  Filled 2016-06-05 (×5): qty 1

## 2016-06-05 MED ORDER — ONDANSETRON HCL 4 MG/2ML IJ SOLN: 4.0000 mg | Freq: Four times a day (QID) | INTRAMUSCULAR | Status: DC | PRN

## 2016-06-05 MED ORDER — HYDROCODONE-ACETAMINOPHEN 5-325 MG PO TABS
1.0000 | ORAL_TABLET | ORAL | Status: DC | PRN
  Administered 2016-06-05: 1 via ORAL
  Administered 2016-06-05 – 2016-06-07 (×7): 2 via ORAL
  Filled 2016-06-05 (×8): qty 2

## 2016-06-05 MED ORDER — POTASSIUM CHLORIDE 20 MEQ/15ML (10%) PO SOLN
40.0000 meq | Freq: Once | ORAL | Status: AC
  Administered 2016-06-05: 40 meq via ORAL
  Filled 2016-06-05 (×2): qty 30

## 2016-06-05 MED ORDER — SENNOSIDES-DOCUSATE SODIUM 8.6-50 MG PO TABS: 1.0000 | ORAL_TABLET | Freq: Every evening | ORAL | Status: DC | PRN

## 2016-06-05 NOTE — Consult Note (Signed)
MEDICATION RELATED CONSULT NOTE - INITIAL   Pharmacy Consult for electrolytes Indication: hypokalemia  No Known Allergies  Patient Measurements: Height: 5' (152.4 cm) Weight: 280 lb (127 kg) IBW/kg (Calculated) : 45.5 Adjusted Body Weight:   Vital Signs: Temp: 98.6 F (37 C) (01/16 1342) Temp Source: Oral (01/16 1342) BP: 124/65 (01/16 1342) Pulse Rate: 79 (01/16 1342) Intake/Output from previous day: 01/15 0701 - 01/16 0700 In: 50 [IV Piggyback:50] Out: -  Intake/Output from this shift: No intake/output data recorded.  Labs:  Recent Labs  06/05/16 0550  WBC 14.7*  HGB 12.4  HCT 37.0  PLT 343  CREATININE 0.81  MG 1.7   Estimated Creatinine Clearance: 125.2 mL/min (by C-G formula based on SCr of 0.81 mg/dL).   Microbiology: No results found for this or any previous visit (from the past 720 hour(s)).  Medical History: History reviewed. No pertinent past medical history.  Medications:  Scheduled:  . clindamycin (CLEOCIN) IV  600 mg Intravenous Q8H  . dexamethasone  10 mg Intravenous Q12H  . enoxaparin (LOVENOX) injection  40 mg Subcutaneous BID  . magnesium sulfate 1 - 4 g bolus IVPB  2 g Intravenous Once  . nicotine  7 mg Transdermal Daily  . potassium chloride (KCL MULTIRUN) 30 mEq in 265 mL IVPB  30 mEq Intravenous Once    Assessment: Pt is a 31 year old female with cellulitis about the right face with subperiosteal abscess along the posterior body of the right mandible. Pt presents with hypokalemia. K=2.8, Mg=1.7 on admission. Pt was given 40 MEQ po of KCL in the ED.  Goal of Therapy:  K=3.5-5 K=1.7-2.4  Plan:  Will give an additional 30 MEQ KCL IV once. Pt has also been ordered maintenance fluids with 20 MEQ of KCL (2MEQ/hr). Mg IV 2g once. Will recheck K this evening at 2000. Follow up all electrolytes in the AM.  Olene FlossMelissa D Myrah Strawderman, Pharm.D, BCPS Clinical Pharmacist  06/05/2016,2:56 PM

## 2016-06-05 NOTE — ED Triage Notes (Signed)
Patient ambulatory to triage with steady gait, without difficulty or distress noted; pt st seen wk ago for dental pain, rx antibiotics and pain med and f/u with dentist but told swelling needed to resolve; persistent pain/swelling to right jaw

## 2016-06-05 NOTE — Progress Notes (Signed)
06/05/2016 4:44 PM  [name], Stephanie Ballard [id number]  Feeling much better, trismus improved    Temp:  [97.9 F (36.6 C)-98.6 F (37 C)] 98.6 F (37 C) (01/16 1342) Pulse Rate:  [71-86] 79 (01/16 1342) Resp:  [18-20] 18 (01/16 1342) BP: (124-139)/(65-87) 124/65 (01/16 1342) SpO2:  [99 %-100 %] 99 % (01/16 1342) Weight:  [127 kg (280 lb)] 127 kg (280 lb) (01/16 0347),     Intake/Output Summary (Last 24 hours) at 06/05/16 1644 Last data filed at 06/05/16 [id number]  Gross per 24 hour  Intake               50 ml  Output                0 ml  Net               50 ml    Results for orders placed or performed during the hospital encounter of 06/05/16 (from the past 24 hour(s))  CBC     Status: Abnormal   Collection Time: 06/05/16  5:50 AM  Result Value Ref Range   WBC 14.7 (H) 3.6 - 11.0 K/uL   RBC 4.33 3.80 - 5.20 MIL/uL   Hemoglobin 12.4 12.0 - 16.0 g/dL   HCT 74.237.0 59.535.0 - 63.847.0 %   MCV 85.4 80.0 - 100.0 fL   MCH 28.7 26.0 - 34.0 pg   MCHC 33.6 32.0 - 36.0 g/dL   RDW 75.614.0 43.311.5 - 29.514.5 %   Platelets 343 150 - 440 K/uL  Basic metabolic panel     Status: Abnormal   Collection Time: 06/05/16  5:50 AM  Result Value Ref Range   Sodium 141 135 - 145 mmol/L   Potassium 2.8 (L) 3.5 - 5.1 mmol/L   Chloride 105 101 - 111 mmol/L   CO2 28 22 - 32 mmol/L   Glucose, Bld 103 (H) 65 - 99 mg/dL   BUN 8 6 - 20 mg/dL   Creatinine, Ser 1.880.81 0.44 - 1.00 mg/dL   Calcium 8.5 (L) 8.9 - 10.3 mg/dL   GFR calc non Af Amer >60 >60 mL/min   GFR calc Af Amer >60 >60 mL/min   Anion gap 8 5 - 15  Magnesium     Status: None   Collection Time: 06/05/16  5:50 AM  Result Value Ref Range   Magnesium 1.7 1.7 - 2.4 mg/dL  POC Urine Pregnancy, ED     Status: None   Collection Time: 06/05/16  8:29 AM  Result Value Ref Range   Preg Test, Ur Negative Negative    SUBJECTIVE:  Feeling a lot better since this morning  OBJECTIVE:  Trismus improved, swelling decreasing  IMPRESSION:  Tooth abscess with cellulitis much  improved.  PLAN:  Recommend at least 24-36 hrs IV antibiotics then shift to PO meds.  Clear liquid diet tonight if better tomorrow advance diet.  Dental appt asap.  Will sign off if she worsens please let us know.   [name] 06/05/2016, 4:44 PM

## 2016-06-05 NOTE — ED Provider Notes (Signed)
[facility] Emergency Department Provider Note   ____________________________________________   First MD Initiated Contact with Patient 06/05/16 [phone]     (approximate)  I have reviewed the triage vital signs and the nursing notes.   HISTORY  Chief Complaint Dental Pain    HPI Theadora Ramaiffany S Everetts is a 31 y.o. female who comes into the hospital today with some dental pain and swelling. The patient reports that she was here a week ago with a toothache and she reports now she is unable to eat. She was given amoxicillin and ibuprofen and some general medicine and told to follow up. She reports that she is unable to follow up as they told her she needed to wait for the swelling to go down. She reports that the swelling has not gone down and it hurts very bad. She reports at this point she is unable to even open her mouth. The patient reports that she's had no fevers but has had some nausea with no vomiting. The patient rates her pain a 10 out of 10 in intensity. She tried some ice packs to her face and salt water rinses but they have not helped. She is here for evaluation.   History reviewed. No pertinent past medical history.  There are no active problems to display for this patient.   Past Surgical History:  Procedure Laterality Date  . CESAREAN SECTION    . cyst removed      Prior to Admission medications   Medication Sig Start Date End Date Taking? Authorizing Provider  amoxicillin (AMOXIL) 500 MG capsule Take 1 capsule (500 mg total) by mouth 3 (three) times daily. 05/26/16   Joni Reiningonald K Smith, PA-C  ibuprofen (ADVIL,MOTRIN) 600 MG tablet Take 1 tablet (600 mg total) by mouth every 6 (six) hours as needed. 05/26/16   Joni Reiningonald K Smith, PA-C  lidocaine (XYLOCAINE) 2 % solution Use as directed 5 mLs in the mouth or throat every 6 (six) hours as needed for mouth pain. 05/26/16   Joni Reiningonald K Smith, PA-C  phenazopyridine (PYRIDIUM) 100 MG tablet Take 1 tablet (100 mg total) by  mouth 3 (three) times daily as needed for pain (May take 1-2 as needed three times daily). 10/11/15   Jami L Hagler, PA-C  sulfamethoxazole-trimethoprim (BACTRIM DS,SEPTRA DS) 800-160 MG tablet Take 1 tablet by mouth 2 (two) times daily. 10/11/15   Jami L Hagler, PA-C  traMADol (ULTRAM) 50 MG tablet Take 1 tablet (50 mg total) by mouth every 6 (six) hours as needed. 05/26/16 05/26/17  Joni Reiningonald K Smith, PA-C    Allergies Patient has no known allergies.  No family history on file.  Social History Social History  Substance Use Topics  . Smoking status: Current Some Day Smoker    Types: Cigarettes  . Smokeless tobacco: Never Used  . Alcohol use Yes     Comment: once/week    Review of Systems Constitutional: No fever/chills Eyes: No visual changes. ENT: Trismus, right-sided facial swelling, dental pain Cardiovascular: Denies chest pain. Respiratory: Denies shortness of breath. Gastrointestinal: No abdominal pain.  No nausea, no vomiting.  No diarrhea.  No constipation. Genitourinary: Negative for dysuria. Musculoskeletal: Negative for back pain. Skin: Negative for rash. Neurological: Negative for headaches, focal weakness or numbness.  10-point ROS otherwise negative.  ____________________________________________   PHYSICAL EXAM:  VITAL SIGNS: ED Triage Vitals  Enc Vitals Group     BP 06/05/16 0349 127/81     Pulse Rate 06/05/16 0349 86  Resp 06/05/16 0349 20     Temp 06/05/16 0349 97.9 F (36.6 C)     Temp Source 06/05/16 0349 Oral     SpO2 06/05/16 0349 100 %     Weight 06/05/16 0347 280 lb (127 kg)     Height 06/05/16 0347 5' (1.524 m)     Head Circumference --      Peak Flow --      Pain Score 06/05/16 0347 10     Pain Loc --      Pain Edu? --      Excl. in GC? --     Constitutional: Alert and oriented. Well appearing and in Moderate distress. Eyes: Conjunctivae are normal. PERRL. EOMI. Head: Atraumatic. Nose: No congestion/rhinnorhea. Mouth/Throat: Mucous  membranes are moist.  Oropharynx non-erythematous. Trismus present with some swelling and a nodule palpated to the patient's right face along the jaw. Hematological/Lymphatic/Immunilogical: Tender cervical lymphadenopathy. Cardiovascular: Normal rate, regular rhythm. Grossly normal heart sounds.  Good peripheral circulation. Respiratory: Normal respiratory effort.  No retractions. Lungs CTAB. Gastrointestinal: Soft and nontender. No distention. Positive bowel sounds Musculoskeletal: No lower extremity tenderness nor edema.   Neurologic:  Normal speech and language.  Skin:  Skin is warm, dry and intact.  Psychiatric: Mood and affect are normal.   ____________________________________________   LABS (all labs ordered are listed, but only abnormal results are displayed)  Labs Reviewed  CBC - Abnormal; Notable for the following:       Result Value   WBC 14.7 (*)    All other components within normal limits  BASIC METABOLIC PANEL - Abnormal; Notable for the following:    Potassium 2.8 (*)    Glucose, Bld 103 (*)    Calcium 8.5 (*)    All other components within normal limits  CULTURE, BLOOD (ROUTINE X 2)  CULTURE, BLOOD (ROUTINE X 2)  POC URINE PREG, ED   ____________________________________________  EKG  none ____________________________________________  RADIOLOGY  CT max face with contrast ____________________________________________   PROCEDURES  Procedure(s) performed: None  Procedures  Critical Care performed: No  ____________________________________________   INITIAL IMPRESSION / ASSESSMENT AND PLAN / ED COURSE  Pertinent labs & imaging results that were available during my care of the patient were reviewed by me and considered in my medical decision making (see chart for details).  This is a 31 year old female who comes into the hospital today with some right-sided facial pain and dental swelling. The patient has trismus with a concern for dental abscess. I  will check some blood work and give the patient some morphine as well as some clindamycin. Depending on the results of the blood work I will send the patient for a CT scan to evaluate for a large abscess that needs to be drained.  Clinical Course    The patient's care will be signed out to Dr Sharma Covert who will follow up the results of the patient's CT scan and determine the patient's disposition.   ____________________________________________   FINAL CLINICAL IMPRESSION(S) / ED DIAGNOSES  Final diagnoses:  Pain, dental  Facial abscess      NEW MEDICATIONS STARTED DURING THIS VISIT:  New Prescriptions   No medications on file     Note:  This document was prepared using Dragon voice recognition software and may include unintentional dictation errors.    Rebecka Apley, MD 06/05/16 [phone]

## 2016-06-05 NOTE — Consult Note (Addendum)
MEDICATION RELATED CONSULT NOTE - INITIAL   Pharmacy Consult for electrolytes Indication: hypokalemia  No Known Allergies  Patient Measurements: Height: 5' (152.4 cm) Weight: 280 lb (127 kg) IBW/kg (Calculated) : 45.5 Adjusted Body Weight:   Vital Signs: Temp: 98.2 F (36.8 C) (01/16 1859) Temp Source: Oral (01/16 1859) BP: 109/64 (01/16 1859) Pulse Rate: 82 (01/16 1859) Intake/Output from previous day: 01/15 0701 - 01/16 0700 In: 50 [IV Piggyback:50] Out: -  Intake/Output from this shift: No intake/output data recorded.  Labs:  Recent Labs  06/05/16 0550  WBC 14.7*  HGB 12.4  HCT 37.0  PLT 343  CREATININE 0.81  MG 1.7   Estimated Creatinine Clearance: 125.2 mL/min (by C-G formula based on SCr of 0.81 mg/dL).   Microbiology: No results found for this or any previous visit (from the past 720 hour(s)).  Medical History: History reviewed. No pertinent past medical history.  Medications:  Scheduled:  . clindamycin (CLEOCIN) IV  600 mg Intravenous Q8H  . dexamethasone  10 mg Intravenous Q12H  . enoxaparin (LOVENOX) injection  40 mg Subcutaneous BID  . magnesium sulfate 1 - 4 g bolus IVPB  2 g Intravenous Once  . nicotine  7 mg Transdermal Daily    Assessment: Pt is a 31 year old female with cellulitis about the right face with subperiosteal abscess along the posterior body of the right mandible. Pt presents with hypokalemia. K=2.8, Mg=1.7 on admission. Pt was given 40 MEQ po of KCL in the ED.  Goal of Therapy:  K=3.5-5 K=1.7-2.4  Plan:  01/16 @2005    K= 3.8. Patient is still receiving NaCl with 5120meq/L @  17500ml/hr. No supplementation needed at this time. Follow up all electrolytes in the AM.  1/17 0624 K 3.9, Mg 2.1. Remains on IVF with 20 mEq/L at 100 mL/hr. No further supplementation required at this time. Will recheck all electrolytes with AM labs. NAC   Gardner CandleSheema M Hallaji, PharmD, BCPS Clinical Pharmacist 06/05/2016 8:58 PM

## 2016-06-05 NOTE — ED Notes (Signed)
Patient transported to CT 

## 2016-06-05 NOTE — Consult Note (Signed)
Stephanie Ballard, Stephanie Ballard [id number] 11/17/1985 Adrian SaranSital Mody, MD  Reason for Consult: Tooth abscess with facial swelling  HPI: 31 year old female otherwise healthy for approximately 10 days to suffer from a toothache. Was seen by her dentist approximately week ago was diagnosed with periapical tooth abscess and was treated with amoxicillin. She believes the dose was 500 mg 3 times a day. Subsequent the pain has gotten worse present to the emergency room today for evaluation. She has received a dose of clindamycin Decadron and pain medication and is feeling better at this point.  Allergies: No Known Allergies  ROS: Review of systems normal other than 12 systems except per HPI.  PMH: History reviewed. No pertinent past medical history.  FH: No family history on file.  SH:  Social History   Social History  . Marital status: Single    Spouse name: N/A  . Number of children: N/A  . Years of education: N/A   Occupational History  . Not on file.   Social History Main Topics  . Smoking status: Current Some Day Smoker    Types: Cigarettes  . Smokeless tobacco: Never Used  . Alcohol use Yes     Comment: once/week  . Drug use: No  . Sexual activity: Not on file   Other Topics Concern  . Not on file   Social History Narrative  . No narrative on file    PSH:  Past Surgical History:  Procedure Laterality Date  . CESAREAN SECTION    . cyst removed      Physical  Exam: CN 2-12 grossly intact and symmetric. EAC/TMs normal BL. Healthy-appearing female obvious swelling right face over the mandible. There is no significant erythema there is no fluctuance palpable she does have significant trismus with tenderness over the right aspect of the mandible. Skin warm and dry. Nasal cavity without polyps or purulence. External nose and ears without masses or lesions. EOMI, PERRLA. There is no evidence of significant cervical adenopathy palpable. There does not appear to be submandibular or floor mouth  involvement.. Thyroid normal with no masses.   Review of CT scan shows a small collection of pus lateral to the mandible that extends along the mandible approximately 2 cm it does not appear to extend down into the neck or submandibular space there is significant edema around the mass or muscle and into the subcutaneous fat overlying this.  A/P: Tooth abscess with significant facial edema and surrounding cellulitis. My recommendation would be a trial of IV antibiotics and IV Decadron. I would recommend clindamycin 300 mg 3 times a day and Decadron 10 mg every 8 hours as well as pain control. This abscess does not appear to interfere with the airway is localized to the lateral aspect of the mandible. The majority the swelling is related to the cellulitis associated with this small abscess. If her symptoms worsen or have not improved within 24 hours recommend repeating CT scan to reassess this abscess. In the meantime would recommend we or liquids by mouth only in the event that she needs to be taken to the operating room in the future. For any further questions feel free to contact me.   Alexandr Yaworski T 06/05/2016 9:45 AM

## 2016-06-05 NOTE — Progress Notes (Signed)
Anticoagulation monitoring(Lovenox):  30yo  female ordered Lovenox 40 mg Q24h  Filed Weights   06/05/16 0347  Weight: 280 lb (127 kg)   BMI 55   Lab Results  Component Value Date   CREATININE 0.81 06/05/2016   CREATININE 0.82 05/07/2013   Estimated Creatinine Clearance: 125.2 mL/min (by C-G formula based on SCr of 0.81 mg/dL). Hemoglobin & Hematocrit     Component Value Date/Time   HGB 12.4 06/05/2016 0550   HGB 12.6 05/07/2013 0923   HCT 37.0 06/05/2016 0550   HCT 38.6 05/07/2013 0923     Per Protocol for Patient with estCrcl >30 ml/min and BMI > 40, will transition to Lovenox 40 mg BID.

## 2016-06-05 NOTE — H&P (Signed)
[facility] - [place] at [facility]   PATIENT NAME: Stephanie Ballard    MR#:  [id number]  DATE OF BIRTH:  1985-11-07  DATE OF ADMISSION:  06/05/2016  PRIMARY CARE PHYSICIAN: Evelene Croon, MD   REQUESTING/REFERRING PHYSICIAN: dr Sharma Covert  CHIEF COMPLAINT:  Dental pain and swelling right  HISTORY OF PRESENT ILLNESS:  Stephanie Ballard  is a 31 y.o. female with no past middle history presents with right upper molar pain and swelling. She has had a toothache for approximately one and half weeks. She was seen by her dentist about a week ago and diagnosed with a tooth abscess. She was sent home with a Rx for amoxicillin. Over the past several days the pain and swelling has gotten worse so she came to the ED for further evaluation  ENT physician was contact by ER physician who recommended Decadron and clindamycin.  PAST MEDICAL   HISTORY:  none  PAST SURGICAL HISTORY:   Past Surgical History:  Procedure Laterality Date  . CESAREAN SECTION    . cyst removed      SOCIAL HISTORY:   Social History  Substance Use Topics  . Smoking status: Current Some Day Smoker    Types: Cigarettes  . Smokeless tobacco: Never Used  . Alcohol use Yes     Comment: once/week    FAMILY HISTORY:  No hx HTN CHF CAD Mom with ESRD on HD  DRUG ALLERGIES:  No Known Allergies  REVIEW OF SYSTEMS:   Review of Systems  Constitutional: Negative for chills, fever and malaise/fatigue.  HENT: Negative.  Negative for congestion, ear discharge, ear pain, hearing loss, nosebleeds, sinus pain and sore throat.        Dental pain swelling  Eyes: Negative.  Negative for blurred vision and pain.  Respiratory: Negative.  Negative for cough, hemoptysis, shortness of breath, wheezing and stridor.   Cardiovascular: Negative.  Negative for chest pain, palpitations and leg swelling.  Gastrointestinal: Negative.  Negative for abdominal pain, blood in stool, diarrhea, nausea and vomiting.  Genitourinary:  Negative.  Negative for dysuria.  Musculoskeletal: Negative.  Negative for back pain.  Skin: Negative.   Neurological: Negative for dizziness, tremors, speech change, focal weakness, seizures, weakness and headaches.  Endo/Heme/Allergies: Negative.  Does not bruise/bleed easily.  Psychiatric/Behavioral: Negative.  Negative for depression, hallucinations and suicidal ideas.    MEDICATIONS AT HOME:   Prior to Admission medications   Medication Sig Start Date End Date Taking? Authorizing Provider  amoxicillin (AMOXIL) 500 MG capsule Take 1 capsule (500 mg total) by mouth 3 (three) times daily. 05/26/16  Yes Joni Reining, PA-C  ibuprofen (ADVIL,MOTRIN) 600 MG tablet Take 1 tablet (600 mg total) by mouth every 6 (six) hours as needed. 05/26/16  Yes Joni Reining, PA-C  traMADol (ULTRAM) 50 MG tablet Take 1 tablet (50 mg total) by mouth every 6 (six) hours as needed. 05/26/16 05/26/17 Yes Joni Reining, PA-C  lidocaine (XYLOCAINE) 2 % solution Use as directed 5 mLs in the mouth or throat every 6 (six) hours as needed for mouth pain. 05/26/16   Joni Reining, PA-C      VITAL SIGNS:  Blood pressure 134/87, pulse 71, temperature 97.9 F (36.6 C), temperature source Oral, resp. rate 20, height 5' (1.524 m), weight 127 kg (280 lb), last menstrual period 05/22/2016, SpO2 100 %.  PHYSICAL EXAMINATION:   Physical Exam  Constitutional: She is oriented to person, place, and time and well-developed, well-nourished, and in no distress. No  distress.  HENT:  Head: Normocephalic.  Right Facial edema  Eyes: No scleral icterus.  Neck: Normal range of motion. Neck supple. No JVD present. No tracheal deviation present.  Cardiovascular: Normal rate, regular rhythm and normal heart sounds.  Exam reveals no gallop and no friction rub.   No murmur heard. Pulmonary/Chest: Effort normal and breath sounds normal. No respiratory distress. She has no wheezes. She has no rales. She exhibits no tenderness.  Abdominal:  Soft. Bowel sounds are normal. She exhibits no distension and no mass. There is no tenderness. There is no rebound and no guarding.  Musculoskeletal: Normal range of motion. She exhibits no edema.  Lymphadenopathy:    She has cervical adenopathy.  Neurological: She is alert and oriented to person, place, and time.  Skin: Skin is warm. No rash noted. No erythema.  Psychiatric: Affect and judgment normal.      LABORATORY PANEL:   CBC  Recent Labs Lab 06/05/16 0550  WBC 14.7*  HGB 12.4  HCT 37.0  PLT 343   ------------------------------------------------------------------------------------------------------------------  Chemistries   Recent Labs Lab 06/05/16 0550  NA 141  K 2.8*  CL 105  CO2 28  GLUCOSE 103*  BUN 8  CREATININE 0.81  CALCIUM 8.5*   ------------------------------------------------------------------------------------------------------------------  Cardiac Enzymes No results for input(s): TROPONINI in the last 168 hours. ------------------------------------------------------------------------------------------------------------------  RADIOLOGY:  Ct Maxillofacial W Contrast  Result Date: 06/05/2016 CLINICAL DATA:  Right facial pain and swelling which have worsened over the past week. The patient reports a toothache. EXAM: CT MAXILLOFACIAL WITH CONTRAST TECHNIQUE: Multidetector CT imaging of the maxillofacial structures was performed. Multiplanar CT image reconstructions were also generated. A small metallic BB was placed on the right temple in order to reliably differentiate right from left. CONTRAST:  75 ml ISOVUE-300 IOPAMIDOL (ISOVUE-300) INJECTION 61% COMPARISON:  None. FINDINGS: Osseous: There is periapical lucency about the posterior most molar in the right mandible consistent abscess. There is a large cavity in the molar. Orbits: Negative. Sinuses: Negative. Soft tissues: Stranding in subcutaneous fatty tissues of the right side of the face is  consistent with cellulitis. A subperiosteal abscess is seen along the right mandible measuring 2.6 cm AP x 0.8 cm transverse x 1.9 cm craniocaudal. There is myositis of the right masseter muscle belly and an intramuscular abscess which communicates with the main abscess described above. Intramuscular component measures 1 cm AP x 0.8 cm transverse x 1.6 cm craniocaudal. Limited intracranial: Negative. IMPRESSION: Cellulitis about the right face with a subperiosteal abscess along the posterior body of the right mandible. The abscess extends into the right masseter muscle consistent with pyomyositis. The abscess in the emanates from a large cavity and subperiosteal abscess in the posterior most right mandibular molar. Electronically Signed   By: Drusilla Kannerhomas  Dalessio M.D.   On: 06/05/2016 09:02    EKG:  non  IMPRESSION AND PLAN:    31 year old female who presents with right sided dental pain.  1. Cellulitis about the right face with a subperiosteal abscess along the posterior body of the right mandible. The abscess extends into the right masseter muscle consistent with pyomyositis: Ed MD contacted ENT MD who advised IV decadron and IV clindamycin. ENT to evaluate make further recommendations, considering draining abscess if no improvement. Nothing by mouth for now.  2. Leukocytosis due to problem #1. Repeat CBC in a.m.  3. Hypokalemia:Check magnesium and replete.  4. Tobacco dependence; Patient is encouraged to stop smoking completely. Patient would nicotine patch which is ordered.  Counseling  has been provided for 4 minutes.  All the records are reviewed and case discussed with ED provider. Management plans discussed with the patient and she in agreement  CODE STATUS: full  TOTAL TIME TAKING CARE OF THIS PATIENT: 45 minutes.    Rowene Suto M.D on 06/05/2016 at 9:32 AM  Between 7am to 6pm - Pager - [phone]  After 6pm go to [email or web address] - Social research officer, government  [facility]   Office  [phone]  CC: Primary care physician; Evelene Croon, MD

## 2016-06-06 LAB — CBC
HCT: 39 % (ref 35.0–47.0)
Hemoglobin: 12.9 g/dL (ref 12.0–16.0)
MCH: 28.2 pg (ref 26.0–34.0)
MCHC: 33.1 g/dL (ref 32.0–36.0)
MCV: 85.3 fL (ref 80.0–100.0)
PLATELETS: 391 10*3/uL (ref 150–440)
RBC: 4.57 MIL/uL (ref 3.80–5.20)
RDW: 14.2 % (ref 11.5–14.5)
WBC: 17.3 10*3/uL — AB (ref 3.6–11.0)

## 2016-06-06 LAB — BASIC METABOLIC PANEL
ANION GAP: 6 (ref 5–15)
BUN: 7 mg/dL (ref 6–20)
CHLORIDE: 107 mmol/L (ref 101–111)
CO2: 23 mmol/L (ref 22–32)
Calcium: 9 mg/dL (ref 8.9–10.3)
Creatinine, Ser: 0.53 mg/dL (ref 0.44–1.00)
GFR calc Af Amer: >60 mL/min (ref >60)
Glucose, Bld: 134 mg/dL — ABNORMAL HIGH (ref 65–99)
POTASSIUM: 3.9 mmol/L (ref 3.5–5.1)
SODIUM: 136 mmol/L (ref 135–145)

## 2016-06-06 LAB — MAGNESIUM: MAGNESIUM: 2.1 mg/dL (ref 1.7–2.4)

## 2016-06-06 NOTE — Progress Notes (Signed)
[facility] - [place] at [facility]   PATIENT NAME: Stephanie Ballard    MR#:  [id number]  DATE OF BIRTH:  07/02/1985  SUBJECTIVE:  CHIEF COMPLAINT:   Chief Complaint  Patient presents with  . Dental Pain   - Admitted with dental abscess and facial cellulitis. Significant pain and tenderness on the right side of the face. -No fevers  REVIEW OF SYSTEMS:  Review of Systems  Constitutional: Negative for chills, fever and malaise/fatigue.  HENT: Positive for sore throat. Negative for ear discharge, ear pain and nosebleeds.   Eyes: Negative for blurred vision and double vision.  Respiratory: Negative for cough, shortness of breath and wheezing.   Cardiovascular: Negative for chest pain, palpitations and leg swelling.  Gastrointestinal: Negative for abdominal pain, constipation, diarrhea, nausea and vomiting.  Genitourinary: Negative for dysuria.  Musculoskeletal: Negative for myalgias.  Neurological: Negative for dizziness, sensory change, speech change, focal weakness, seizures and headaches.  Psychiatric/Behavioral: Negative for depression.    DRUG ALLERGIES:  No Known Allergies  VITALS:  Blood pressure 119/73, pulse 72, temperature 97.9 F (36.6 C), temperature source Oral, resp. rate 18, height 5' (1.524 m), weight 127 kg (280 lb), last menstrual period 05/22/2016, SpO2 98 %.  PHYSICAL EXAMINATION:  Physical Exam  GENERAL:  31 y.o.-year-old Morbidly obese patient lying in the bed with no acute distress.  EYES: Pupils equal, round, reactive to light and accommodation. No scleral icterus. Extraocular muscles intact.  HEENT: Head atraumatic, normocephalic. Oropharynx and nasopharynx clear. Significant swelling of the left side of the face under her jaw and tenderness noted. Tender lymphadenopathy noted. Patient unable to open her mouth completely due to pain NECK:  Supple, no jugular venous distention. No thyroid enlargement, no tenderness.  LUNGS: Normal breath  sounds bilaterally, no wheezing, rales,rhonchi or crepitation. No use of accessory muscles of respiration.  CARDIOVASCULAR: S1, S2 normal. No murmurs, rubs, or gallops.  ABDOMEN: Soft, nontender, obese, nondistended. Bowel sounds present. No organomegaly or mass.  EXTREMITIES: No pedal edema, cyanosis, or clubbing.  NEUROLOGIC: Cranial nerves II through XII are intact. Muscle strength 5/5 in all extremities. Sensation intact. Gait not checked.  PSYCHIATRIC: The patient is alert and oriented x 3.  SKIN: No obvious rash, lesion, or ulcer.    LABORATORY PANEL:   CBC  Recent Labs Lab 06/06/16 0624  WBC 17.3*  HGB 12.9  HCT 39.0  PLT 391   ------------------------------------------------------------------------------------------------------------------  Chemistries   Recent Labs Lab 06/06/16 0624  NA 136  K 3.9  CL 107  CO2 23  GLUCOSE 134*  BUN 7  CREATININE 0.53  CALCIUM 9.0  MG 2.1   ------------------------------------------------------------------------------------------------------------------  Cardiac Enzymes No results for input(s): TROPONINI in the last 168 hours. ------------------------------------------------------------------------------------------------------------------  RADIOLOGY:  Ct Maxillofacial W Contrast  Result Date: 06/05/2016 CLINICAL DATA:  Right facial pain and swelling which have worsened over the past week. The patient reports a toothache. EXAM: CT MAXILLOFACIAL WITH CONTRAST TECHNIQUE: Multidetector CT imaging of the maxillofacial structures was performed. Multiplanar CT image reconstructions were also generated. A small metallic BB was placed on the right temple in order to reliably differentiate right from left. CONTRAST:  75 ml ISOVUE-300 IOPAMIDOL (ISOVUE-300) INJECTION 61% COMPARISON:  None. FINDINGS: Osseous: There is periapical lucency about the posterior most molar in the right mandible consistent abscess. There is a large cavity in the  molar. Orbits: Negative. Sinuses: Negative. Soft tissues: Stranding in subcutaneous fatty tissues of the right side of the face is consistent with cellulitis.  A subperiosteal abscess is seen along the right mandible measuring 2.6 cm AP x 0.8 cm transverse x 1.9 cm craniocaudal. There is myositis of the right masseter muscle belly and an intramuscular abscess which communicates with the main abscess described above. Intramuscular component measures 1 cm AP x 0.8 cm transverse x 1.6 cm craniocaudal. Limited intracranial: Negative. IMPRESSION: Cellulitis about the right face with a subperiosteal abscess along the posterior body of the right mandible. The abscess extends into the right masseter muscle consistent with pyomyositis. The abscess in the emanates from a large cavity and subperiosteal abscess in the posterior most right mandibular molar. Electronically Signed   By: Drusilla Kanner M.D.   On: 06/05/2016 09:02    EKG:  No orders found for this or any previous visit.  ASSESSMENT AND PLAN:   31 year old morbidly obese female with no significant past medical history presents to hospital secondary to right upper tooth abscess and facial cellulitis.  #1 right maxillary dental abscess and facial cellulitis-no stridor or respiratory compromise noted. -Appreciate ENT consult. Patient on IV steroids and IV clindamycin. -Advance to full liquid diet today. Slowly improving. Continue 1 more day of IV antibiotics and steroids. -No surgery needed at this time. No trismus. Outpatient follow-up with the dentist as soon as discharge recommended.  #2 leukocytosis-secondary to steroid use and underlying infection.  #3 tobacco use disorder-counseled on admission and on nicotine patch  #4 hypokalemia-replaced on admission.  #5 DVT prophylaxis-weight adjusted dose of Lovenox   All the records are reviewed and case discussed with Care Management/Social Workerr. Management plans discussed with the patient,  family and they are in agreement.  CODE STATUS: Full code  TOTAL TIME TAKING CARE OF THIS PATIENT: 38 minutes.   POSSIBLE D/C tomorrow, DEPENDING ON CLINICAL CONDITION.   Enid Baas M.D on 06/06/2016 at 2:11 PM  Between 7am to 6pm - Pager - [phone]  After 6pm go to [email or web address] - Social research officer, government  [facility] [place] [facility]  Office  [phone]  CC: Primary care physician; Evelene Croon, MD

## 2016-06-07 LAB — CBC
HCT: 34.1 % — ABNORMAL LOW (ref 35.0–47.0)
Hemoglobin: 11.6 g/dL — ABNORMAL LOW (ref 12.0–16.0)
MCH: 28.8 pg (ref 26.0–34.0)
MCHC: 34.1 g/dL (ref 32.0–36.0)
MCV: 84.7 fL (ref 80.0–100.0)
PLATELETS: 340 10*3/uL (ref 150–440)
RBC: 4.03 MIL/uL (ref 3.80–5.20)
RDW: 14.3 % (ref 11.5–14.5)
WBC: 19.7 10*3/uL — AB (ref 3.6–11.0)

## 2016-06-07 LAB — BASIC METABOLIC PANEL
ANION GAP: 5 (ref 5–15)
BUN: 9 mg/dL (ref 6–20)
CALCIUM: 8.6 mg/dL — AB (ref 8.9–10.3)
CO2: 25 mmol/L (ref 22–32)
CREATININE: 0.61 mg/dL (ref 0.44–1.00)
Chloride: 108 mmol/L (ref 101–111)
Glucose, Bld: 133 mg/dL — ABNORMAL HIGH (ref 65–99)
Potassium: 3.9 mmol/L (ref 3.5–5.1)
SODIUM: 138 mmol/L (ref 135–145)

## 2016-06-07 LAB — PHOSPHORUS: PHOSPHORUS: 3.3 mg/dL (ref 2.5–4.6)

## 2016-06-07 LAB — MAGNESIUM: MAGNESIUM: 1.9 mg/dL (ref 1.7–2.4)

## 2016-06-07 MED ORDER — RISAQUAD PO CAPS: 1.0000 | ORAL_CAPSULE | Freq: Every day | ORAL | 0 refills | Status: DC

## 2016-06-07 MED ORDER — CLINDAMYCIN HCL 300 MG PO CAPS: 300.0000 mg | ORAL_CAPSULE | Freq: Three times a day (TID) | ORAL | 0 refills | Status: DC

## 2016-06-07 MED ORDER — RISAQUAD PO CAPS
1.0000 | ORAL_CAPSULE | Freq: Every day | ORAL | Status: DC
  Administered 2016-06-07: 1 via ORAL
  Filled 2016-06-07: qty 1

## 2016-06-07 MED ORDER — DEXAMETHASONE 4 MG PO TABS
4.0000 mg | ORAL_TABLET | Freq: Two times a day (BID) | ORAL | Status: DC
  Administered 2016-06-07: 4 mg via ORAL
  Filled 2016-06-07 (×2): qty 1

## 2016-06-07 MED ORDER — DEXAMETHASONE 4 MG PO TABS: 4.0000 mg | ORAL_TABLET | Freq: Two times a day (BID) | ORAL | 0 refills | Status: DC

## 2016-06-07 MED ORDER — CLINDAMYCIN HCL 150 MG PO CAPS
300.0000 mg | ORAL_CAPSULE | Freq: Four times a day (QID) | ORAL | Status: DC
  Administered 2016-06-07: 300 mg via ORAL
  Filled 2016-06-07: qty 2

## 2016-06-07 MED ORDER — HYDROCODONE-ACETAMINOPHEN 5-325 MG PO TABS: 1.0000 | ORAL_TABLET | ORAL | 0 refills | Status: DC | PRN

## 2016-06-07 NOTE — Progress Notes (Signed)
     Daryll Drowniffany Acheampong was admitted to the [facility] on 06/05/2016 for an acute medical condition and is being Discharged on  06/07/2016 . She will need another 2 days for recovery and so advised to stay away at home.Marland Kitchen. So please excuse her  for the above  Days.  Call Enid Baasadhika Gradie Butrick  MD, [facility] at  [phone] with questions.  Enid BaasKALISETTI,Lysle Yero M.D on 06/07/2016,at 9:42 AM  [facility] [address], [place] [id number]

## 2016-06-07 NOTE — Consult Note (Signed)
MEDICATION RELATED CONSULT NOTE - INITIAL   Pharmacy Consult for electrolytes Indication: hypokalemia  No Known Allergies  Patient Measurements: Height: 5' (152.4 cm) Weight: 280 lb (127 kg) IBW/kg (Calculated) : 45.5 Adjusted Body Weight:   Vital Signs: Temp: 97.6 F (36.4 C) (01/18 0342) Temp Source: Oral (01/18 0342) BP: 116/55 (01/18 0342) Pulse Rate: 71 (01/18 0342) Intake/Output from previous day: 01/17 0701 - 01/18 0700 In: 1140 [P.O.:1140] Out: 900 [Urine:900] Intake/Output from this shift: Total I/O In: -  Out: 700 [Urine:700]  Labs:  Recent Labs  06/05/16 0550 06/06/16 0624 06/07/16 0509  WBC 14.7* 17.3* 19.7*  HGB 12.4 12.9 11.6*  HCT 37.0 39.0 34.1*  PLT 343 391 340  CREATININE 0.81 0.53 0.61  MG 1.7 2.1 1.9  PHOS  --   --  3.3   Estimated Creatinine Clearance: 126.8 mL/min (by C-G formula based on SCr of 0.61 mg/dL).   Microbiology: Recent Results (from the past 720 hour(s))  Blood culture (routine x 2)     Status: None (Preliminary result)   Collection Time: 06/05/16  5:50 AM  Result Value Ref Range Status   Specimen Description BLOOD  R AC  Final   Special Requests   Final    BOTTLES DRAWN AEROBIC AND ANAEROBIC  AER 11 ML ANA 12 ML   Culture NO GROWTH 1 DAY  Final   Report Status PENDING  Incomplete  Blood culture (routine x 2)     Status: None (Preliminary result)   Collection Time: 06/05/16  5:50 AM  Result Value Ref Range Status   Specimen Description BLOOD  L AC  Final   Special Requests   Final    BOTTLES DRAWN AEROBIC AND ANAEROBIC  AER 13 ML ANA 13 ML   Culture NO GROWTH 1 DAY  Final   Report Status PENDING  Incomplete    Medical History: History reviewed. No pertinent past medical history.  Medications:  Scheduled:  . clindamycin (CLEOCIN) IV  600 mg Intravenous Q8H  . dexamethasone  10 mg Intravenous Q12H  . enoxaparin (LOVENOX) injection  40 mg Subcutaneous BID  . nicotine  7 mg Transdermal Daily    Assessment: Pt  is a 31 year old female with cellulitis about the right face with subperiosteal abscess along the posterior body of the right mandible. Pt presents with hypokalemia. K=2.8, Mg=1.7 on admission. Pt was given 40 MEQ po of KCL in the ED.  Goal of Therapy:  K=3.5-5 K=1.7-2.4  Plan:  01/16 @2005    K= 3.8. Patient is still receiving NaCl with 6620meq/L @  15500ml/hr. No supplementation needed at this time. Follow up all electrolytes in the AM.  1/17 0624 K 3.9, Mg 2.1. Remains on IVF with 20 mEq/L at 100 mL/hr. No further supplementation required at this time. Will recheck all electrolytes with AM labs. NAC   1/18 0509 electrolytes WNL. Will recheck with AM labs in 2 days.  Carola FrostNathan A Conception Doebler, PharmD, BCPS Clinical Pharmacist 06/07/2016 6:29 AM

## 2016-06-07 NOTE — Discharge Summary (Signed)
[facility] [place] at [facility]   PATIENT NAME: Stephanie Ballard    MR#:  [id number]  DATE OF BIRTH:  04/14/1986  DATE OF ADMISSION:  06/05/2016   ADMITTING PHYSICIAN: Adrian Saran, MD  DATE OF DISCHARGE: 06/07/16  PRIMARY CARE PHYSICIAN: Evelene Croon, MD   ADMISSION DIAGNOSIS:   Facial abscess [L02.01] Pain, dental [K08.89]  DISCHARGE DIAGNOSIS:   Active Problems:   Dental abscess   SECONDARY DIAGNOSIS:   History reviewed. No pertinent past medical history.  HOSPITAL COURSE:   31 year old morbidly obese female with no significant past medical history presents to hospital secondary to right upper tooth abscess and facial cellulitis.  #1 right maxillary dental abscess and facial cellulitis-no stridor or respiratory compromise noted. -Appreciate ENT consult. Patient received IV steroids and IV clindamycin. Much improvement noted, still has significant facial muscle tenderness -Tolerating soft diet well - change to oral decadron for 3 more days and oral clindamycin. -No surgery needed at this time. No trismus. Outpatient follow-up with the dentist as soon as discharge recommended.  #2 leukocytosis-secondary to steroid use and underlying infection.  #3 tobacco use disorder-counseled on admission and on nicotine patch  #4 hypokalemia-replaced on admission.  Being discharged today  DISCHARGE CONDITIONS:   Stable  CONSULTS OBTAINED:   Treatment Team:  Linus Salmons, MD  DRUG ALLERGIES:   No Known Allergies DISCHARGE MEDICATIONS:   Allergies as of 06/07/2016   No Known Allergies     Medication List    STOP taking these medications   amoxicillin 500 MG capsule Commonly known as:  AMOXIL   traMADol 50 MG tablet Commonly known as:  ULTRAM     TAKE these medications   acidophilus Caps capsule Take 1 capsule by mouth daily.   clindamycin 300 MG capsule Commonly known as:  CLEOCIN Take 1 capsule (300 mg total) by mouth 3  (three) times daily. X 7 more days   dexamethasone 4 MG tablet Commonly known as:  DECADRON Take 1 tablet (4 mg total) by mouth every 12 (twelve) hours. X 3 more days   HYDROcodone-acetaminophen 5-325 MG tablet Commonly known as:  NORCO/VICODIN Take 1-2 tablets by mouth every 4 (four) hours as needed for moderate pain.   ibuprofen 600 MG tablet Commonly known as:  ADVIL,MOTRIN Take 1 tablet (600 mg total) by mouth every 6 (six) hours as needed.   lidocaine 2 % solution Commonly known as:  XYLOCAINE Use as directed 5 mLs in the mouth or throat every 6 (six) hours as needed for mouth pain.        DISCHARGE INSTRUCTIONS:   1. Dentist f/u in 3-4 days 2. PCP f/u in 1 week  DIET:   Regular diet  ACTIVITY:   Activity as tolerated  OXYGEN:   Home Oxygen: No.  Oxygen Delivery: room air  DISCHARGE LOCATION:   home   If you experience worsening of your admission symptoms, develop shortness of breath, life threatening emergency, suicidal or homicidal thoughts you must seek medical attention immediately by calling 911 or calling your MD immediately  if symptoms less severe.  You Must read complete instructions/literature along with all the possible adverse reactions/side effects for all the Medicines you take and that have been prescribed to you. Take any new Medicines after you have completely understood and accpet all the possible adverse reactions/side effects.   Please note  You were cared for by a hospitalist during your hospital stay. If you have any questions about your discharge medications  or the care you received while you were in the hospital after you are discharged, you can call the unit and asked to speak with the hospitalist on call if the hospitalist that took care of you is not available. Once you are discharged, your primary care physician will handle any further medical issues. Please note that NO REFILLS for any discharge medications will be authorized once  you are discharged, as it is imperative that you return to your primary care physician (or establish a relationship with a primary care physician if you do not have one) for your aftercare needs so that they can reassess your need for medications and monitor your lab values.    On the day of Discharge:  VITAL SIGNS:   Blood pressure 121/75, pulse 72, temperature 97.4 F (36.3 C), temperature source Oral, resp. rate 18, height 5' (1.524 m), weight 127 kg (280 lb), last menstrual period 05/22/2016, SpO2 100 %.  PHYSICAL EXAMINATION:   GENERAL:  31 y.o.-year-old Morbidly obese patient lying in the bed with no acute distress.  EYES: Pupils equal, round, reactive to light and accommodation. No scleral icterus. Extraocular muscles intact.  HEENT: Head atraumatic, normocephalic. Oropharynx and nasopharynx clear. Improving swelling of the left side of the face, left facial muscles are still tight and tender. Tender lymphadenopathy noted. Patient unable to open her mouth completely due to pain NECK:  Supple, no jugular venous distention. No thyroid enlargement, no tenderness.  LUNGS: Normal breath sounds bilaterally, no wheezing, rales,rhonchi or crepitation. No use of accessory muscles of respiration.  CARDIOVASCULAR: S1, S2 normal. No murmurs, rubs, or gallops.  ABDOMEN: Soft, nontender, obese, nondistended. Bowel sounds present. No organomegaly or mass.  EXTREMITIES: No pedal edema, cyanosis, or clubbing.  NEUROLOGIC: Cranial nerves II through XII are intact. Muscle strength 5/5 in all extremities. Sensation intact. Gait not checked.  PSYCHIATRIC: The patient is alert and oriented x 3.  SKIN: No obvious rash, lesion, or ulcer.   DATA REVIEW:   CBC  Recent Labs Lab 06/07/16 0509  WBC 19.7*  HGB 11.6*  HCT 34.1*  PLT 340    Chemistries   Recent Labs Lab 06/07/16 0509  NA 138  K 3.9  CL 108  CO2 25  GLUCOSE 133*  BUN 9  CREATININE 0.61  CALCIUM 8.6*  MG 1.9      Microbiology Results  Results for orders placed or performed during the hospital encounter of 06/05/16  Blood culture (routine x 2)     Status: None (Preliminary result)   Collection Time: 06/05/16  5:50 AM  Result Value Ref Range Status   Specimen Description BLOOD  R AC  Final   Special Requests   Final    BOTTLES DRAWN AEROBIC AND ANAEROBIC  AER 11 ML ANA 12 ML   Culture NO GROWTH 1 DAY  Final   Report Status PENDING  Incomplete  Blood culture (routine x 2)     Status: None (Preliminary result)   Collection Time: 06/05/16  5:50 AM  Result Value Ref Range Status   Specimen Description BLOOD  L AC  Final   Special Requests   Final    BOTTLES DRAWN AEROBIC AND ANAEROBIC  AER 13 ML ANA 13 ML   Culture NO GROWTH 1 DAY  Final   Report Status PENDING  Incomplete    RADIOLOGY:  No results found.   Management plans discussed with the patient, family and they are in agreement.  CODE STATUS:     Code  Status Orders        Start     Ordered   06/05/16 1303  Full code  Continuous     06/05/16 1302    Code Status History    Date Active Date Inactive Code Status Order ID Comments User Context   This patient has a current code status but no historical code status.      TOTAL TIME TAKING CARE OF THIS PATIENT: 38 minutes.    Enid Baas M.D on 06/07/2016 at 9:36 AM  Between 7am to 6pm - Pager - [phone]  After 6pm go to [email or web address] - Social research officer, government  [facility] [place] [facility]  Office  [phone]  CC: Primary care physician; Evelene Croon, MD   Note: This dictation was prepared with Dragon dictation along with smaller phrase technology. Any transcriptional errors that result from this process are unintentional.

## 2016-06-07 NOTE — Progress Notes (Signed)
D/C order from Dr. Nemiah CommanderKalisetti.  Reviewed d/c instructions and prescriptions and answered any questions.  Note for work given to patient as well.

## 2016-06-07 NOTE — Discharge Instructions (Signed)
Dental Abscess °Introduction °A dental abscess is pus in or around a tooth. °Follow these instructions at home: °· Take medicines only as told by your dentist. °· If you were prescribed antibiotic medicine, finish all of it even if you start to feel better. °· Rinse your mouth (gargle) often with salt water. °· Do not drive or use heavy machinery, like a lawn mower, while taking pain medicine. °· Do not apply heat to the outside of your mouth. °· Keep all follow-up visits as told by your dentist. This is important. °Contact a doctor if: °· Your pain is worse, and medicine does not help. °Get help right away if: °· You have a fever or chills. °· Your symptoms suddenly get worse. °· You have a very bad headache. °· You have problems breathing or swallowing. °· You have trouble opening your mouth. °· You have puffiness (swelling) in your neck or around your eye. °This information is not intended to replace advice given to you by your health care provider. Make sure you discuss any questions you have with your health care provider. °Document Released: 09/21/2014 Document Revised: 10/13/2015 Document Reviewed: 05/04/2014 °© 2017 Elsevier ° °

## 2016-06-10 LAB — CULTURE, BLOOD (ROUTINE X 2)
CULTURE: NO GROWTH
Culture: NO GROWTH

## 2016-07-01 DIAGNOSIS — K029 Dental caries, unspecified: Secondary | ICD-10-CM | POA: Diagnosis not present

## 2016-07-01 DIAGNOSIS — K0889 Other specified disorders of teeth and supporting structures: Secondary | ICD-10-CM | POA: Diagnosis present

## 2016-07-01 DIAGNOSIS — Z79899 Other long term (current) drug therapy: Secondary | ICD-10-CM | POA: Diagnosis not present

## 2016-07-01 DIAGNOSIS — F17210 Nicotine dependence, cigarettes, uncomplicated: Secondary | ICD-10-CM | POA: Insufficient documentation

## 2016-07-01 NOTE — ED Triage Notes (Signed)
Patient reports tooth ache to right lower jaw for 2 days.

## 2016-07-02 ENCOUNTER — Emergency Department
Admission: EM | Admit: 2016-07-02 | Discharge: 2016-07-02 | Disposition: A | Discharge status: Home / Self Care | Payer: Medicaid Other | Attending: Emergency Medicine | Admitting: Emergency Medicine

## 2016-07-02 ENCOUNTER — Encounter: Payer: Self-pay | Admitting: Emergency Medicine

## 2016-07-02 DIAGNOSIS — K0889 Other specified disorders of teeth and supporting structures: Secondary | ICD-10-CM

## 2016-07-02 DIAGNOSIS — K029 Dental caries, unspecified: Secondary | ICD-10-CM

## 2016-07-02 MED ORDER — CEPHALEXIN 500 MG PO CAPS: 500.0000 mg | ORAL_CAPSULE | Freq: Four times a day (QID) | ORAL | 0 refills | Status: AC

## 2016-07-02 MED ORDER — TRAMADOL HCL 50 MG PO TABS
50.0000 mg | ORAL_TABLET | Freq: Once | ORAL | Status: DC
  Filled 2016-07-02: qty 1

## 2016-07-02 MED ORDER — IBUPROFEN 600 MG PO TABS
600.0000 mg | ORAL_TABLET | Freq: Once | ORAL | Status: AC
  Administered 2016-07-02: 600 mg via ORAL
  Filled 2016-07-02: qty 1

## 2016-07-02 MED ORDER — LIDOCAINE VISCOUS 2 % MT SOLN
15.0000 mL | Freq: Once | OROMUCOSAL | Status: DC
  Filled 2016-07-02: qty 15

## 2016-07-02 MED ORDER — CEPHALEXIN 500 MG PO CAPS
500.0000 mg | ORAL_CAPSULE | Freq: Once | ORAL | Status: AC
  Administered 2016-07-02: 500 mg via ORAL
  Filled 2016-07-02: qty 1

## 2016-07-02 MED ORDER — TRAMADOL HCL 50 MG PO TABS: 50.0000 mg | ORAL_TABLET | Freq: Four times a day (QID) | ORAL | 0 refills | Status: DC | PRN

## 2016-07-02 MED ORDER — LIDOCAINE VISCOUS 2 % MT SOLN: 20.0000 mL | OROMUCOSAL | 0 refills | Status: DC | PRN

## 2016-07-02 NOTE — ED Provider Notes (Signed)
[facility] Emergency Department Provider Note   ____________________________________________   First MD Initiated Contact with Patient 07/02/16 0025     (approximate)  I have reviewed the triage vital signs and the nursing notes.   HISTORY  Chief Complaint Dental Pain    HPI Stephanie Ballard is a 31 y.o. female who comes into the hospital today reporting that her teeth are hurting. The patient reports that she had an abscess on her right lower tooth 2 weeks ago when she was put on antibiotics. She reports it started hurting again 2-3 days ago. She's not had any fevers but has had some nausea. The patient has not yet followed up with a dentist. She reports that [facility] has [facility] but she is trying to find a dentist who will take her Medicaid. The patient reports that she's been taking Advil and BC powders but has not been helping. The patient rates her pain a 10 out of 10 in intensity. She reports that her face hurts on the right side. She is here today for evaluation of her pain.   History reviewed. No pertinent past medical history.  Patient Active Problem List   Diagnosis Date Noted  . Dental abscess 06/05/2016    Past Surgical History:  Procedure Laterality Date  . CESAREAN SECTION    . cyst removed      Prior to Admission medications   Medication Sig Start Date End Date Taking? Authorizing Provider  acidophilus (RISAQUAD) CAPS capsule Take 1 capsule by mouth daily. 06/07/16   Enid Baasadhika Kalisetti, MD  cephALEXin (KEFLEX) 500 MG capsule Take 1 capsule (500 mg total) by mouth 4 (four) times daily. 07/02/16 07/12/16  Rebecka ApleyAllison P Webster, MD  clindamycin (CLEOCIN) 300 MG capsule Take 1 capsule (300 mg total) by mouth 3 (three) times daily. X 7 more days 06/07/16   Enid Baasadhika Kalisetti, MD  dexamethasone (DECADRON) 4 MG tablet Take 1 tablet (4 mg total) by mouth every 12 (twelve) hours. X 3 more days 06/07/16   Enid Baasadhika Kalisetti, MD    HYDROcodone-acetaminophen (NORCO/VICODIN) 5-325 MG tablet Take 1-2 tablets by mouth every 4 (four) hours as needed for moderate pain. 06/07/16   Enid Baasadhika Kalisetti, MD  ibuprofen (ADVIL,MOTRIN) 600 MG tablet Take 1 tablet (600 mg total) by mouth every 6 (six) hours as needed. 05/26/16   Joni Reiningonald K Smith, PA-C  lidocaine (XYLOCAINE) 2 % solution Use as directed 5 mLs in the mouth or throat every 6 (six) hours as needed for mouth pain. 05/26/16   Joni Reiningonald K Smith, PA-C  lidocaine (XYLOCAINE) 2 % solution Use as directed 20 mLs in the mouth or throat as needed for mouth pain. 07/02/16   Rebecka ApleyAllison P Webster, MD  traMADol (ULTRAM) 50 MG tablet Take 1 tablet (50 mg total) by mouth every 6 (six) hours as needed. 07/02/16   Rebecka ApleyAllison P Webster, MD    Allergies Patient has no known allergies.  No family history on file.  Social History Social History  Substance Use Topics  . Smoking status: Current Some Day Smoker    Types: Cigarettes  . Smokeless tobacco: Never Used  . Alcohol use Yes     Comment: once/week    Review of Systems Constitutional: No fever/chills Eyes: No visual changes. ENT: Dental pain Cardiovascular: Denies chest pain. Respiratory: Denies shortness of breath. Gastrointestinal: No abdominal pain.  No nausea, no vomiting.  No diarrhea.  No constipation. Genitourinary: Negative for dysuria. Musculoskeletal: Negative for back  pain. Skin: Negative for rash. Neurological: Negative for headaches, focal weakness or numbness.  10-point ROS otherwise negative.  ____________________________________________   PHYSICAL EXAM:  VITAL SIGNS: ED Triage Vitals  Enc Vitals Group     BP 07/01/16 2133 126/90     Pulse Rate 07/01/16 2133 94     Resp 07/01/16 2133 20     Temp 07/01/16 2133 97.8 F (36.6 C)     Temp Source 07/01/16 2133 Oral     SpO2 07/01/16 2133 98 %     Weight 07/01/16 2131 280 lb (127 kg)     Height 07/01/16 2131 5' (1.524 m)     Head Circumference --      Peak Flow --       Pain Score 07/01/16 2131 10     Pain Loc --      Pain Edu? --      Excl. in GC? --     Constitutional: Alert and oriented. Well appearing and in Moderate distress. Eyes: Conjunctivae are normal. PERRL. EOMI. Head: Atraumatic. Nose: No congestion/rhinnorhea. Mouth/Throat: Mucous membranes are moist.  Oropharynx non-erythematous. Dental pain to the patient's right mandibular third molar which appears broken with some dental cavities. Cardiovascular: Normal rate, regular rhythm. Grossly normal heart sounds.  Good peripheral circulation. Respiratory: Normal respiratory effort.  No retractions. Lungs CTAB. Gastrointestinal: Soft and nontender. No distention. Positive bowel sounds Musculoskeletal: No lower extremity tenderness nor edema.   Neurologic:  Normal speech and language.  Skin:  Skin is warm, dry and intact. Psychiatric: Mood and affect are normal.   ____________________________________________   LABS (all labs ordered are listed, but only abnormal results are displayed)  Labs Reviewed - No data to display ____________________________________________  EKG  none ____________________________________________  RADIOLOGY  none ____________________________________________   PROCEDURES  Procedure(s) performed: None  Procedures  Critical Care performed: No  ____________________________________________   INITIAL IMPRESSION / ASSESSMENT AND PLAN / ED COURSE  Pertinent labs & imaging results that were available during my care of the patient were reviewed by me and considered in my medical decision making (see chart for details).  This is a 31 year old female who comes into the hospital today with some dental pain. The patient was seen in the hospital recently with the same complaint. I informed the patient that she needs to go and see a dentist. I informed her that the pain will continue to return and the antibiotics is only a temporary measure. I will give the  patient is dose of Keflex and I will give her some tramadol. She will also receive some viscous lidocaine and ibuprofen. I will discharge the patient to home and have her follow-up with a dentist.      ____________________________________________   FINAL CLINICAL IMPRESSION(S) / ED DIAGNOSES  Final diagnoses:  Pain, dental  Dental caries      NEW MEDICATIONS STARTED DURING THIS VISIT:  Discharge Medication List as of 07/02/2016 12:33 AM    START taking these medications   Details  cephALEXin (KEFLEX) 500 MG capsule Take 1 capsule (500 mg total) by mouth 4 (four) times daily., Starting Mon 07/02/2016, Until Thu 07/12/2016, Print    !! lidocaine (XYLOCAINE) 2 % solution Use as directed 20 mLs in the mouth or throat as needed for mouth pain., Starting Mon 07/02/2016, Print    traMADol (ULTRAM) 50 MG tablet Take 1 tablet (50 mg total) by mouth every 6 (six) hours as needed., Starting Mon 07/02/2016, Print     !! - Potential  duplicate medications found. Please discuss with provider.       Note:  This document was prepared using Dragon voice recognition software and may include unintentional dictation errors.    Rebecka Apley, MD 07/02/16 [phone]

## 2016-07-02 NOTE — ED Notes (Signed)
Pt. Going with son.

## 2016-10-02 ENCOUNTER — Emergency Department
Admission: EM | Admit: 2016-10-02 | Discharge: 2016-10-02 | Disposition: A | Discharge status: Home / Self Care | Payer: Medicaid Other | Attending: Emergency Medicine | Admitting: Emergency Medicine

## 2016-10-02 DIAGNOSIS — J209 Acute bronchitis, unspecified: Secondary | ICD-10-CM

## 2016-10-02 DIAGNOSIS — Z79899 Other long term (current) drug therapy: Secondary | ICD-10-CM | POA: Diagnosis not present

## 2016-10-02 DIAGNOSIS — R509 Fever, unspecified: Secondary | ICD-10-CM | POA: Diagnosis not present

## 2016-10-02 DIAGNOSIS — R05 Cough: Secondary | ICD-10-CM | POA: Diagnosis present

## 2016-10-02 DIAGNOSIS — F17210 Nicotine dependence, cigarettes, uncomplicated: Secondary | ICD-10-CM | POA: Insufficient documentation

## 2016-10-02 MED ORDER — PREDNISONE 20 MG PO TABS
60.0000 mg | ORAL_TABLET | Freq: Once | ORAL | Status: AC
  Administered 2016-10-02: 60 mg via ORAL
  Filled 2016-10-02: qty 3

## 2016-10-02 MED ORDER — PREDNISONE 10 MG PO TABS: 50.0000 mg | ORAL_TABLET | Freq: Every day | ORAL | 0 refills | Status: DC

## 2016-10-02 MED ORDER — AZITHROMYCIN 250 MG PO TABS: ORAL_TABLET | ORAL | 0 refills | Status: DC

## 2016-10-02 MED ORDER — ALBUTEROL SULFATE HFA 108 (90 BASE) MCG/ACT IN AERS: 2.0000 | INHALATION_SPRAY | Freq: Four times a day (QID) | RESPIRATORY_TRACT | 2 refills | Status: DC | PRN

## 2016-10-02 MED ORDER — GUAIFENESIN-CODEINE 100-10 MG/5ML PO SYRP: 5.0000 mL | ORAL_SOLUTION | Freq: Three times a day (TID) | ORAL | 0 refills | Status: DC | PRN

## 2016-10-02 MED ORDER — IPRATROPIUM-ALBUTEROL 0.5-2.5 (3) MG/3ML IN SOLN
3.0000 mL | Freq: Once | RESPIRATORY_TRACT | Status: AC
  Administered 2016-10-02: 3 mL via RESPIRATORY_TRACT
  Filled 2016-10-02: qty 3

## 2016-10-02 NOTE — ED Triage Notes (Signed)
Pt presents to ED with daughter via POV with c/o productive cough (green phlegm) fever since Sunday. Max temp at home reported "I think it was a 100". (+) everyday smoker. Reports pain in upper back from coughing so much. Daughter here to be seen for same. No OTC meds taken for s'x/s.

## 2016-10-02 NOTE — ED Provider Notes (Signed)
[facility] Emergency Department Provider Note  ____________________________________________  Time seen: Approximately 11:27 PM  I have reviewed the triage vital signs and the nursing notes.   HISTORY  Chief Complaint Cough   HPI Stephanie Ballard is a 31 y.o. female who presents to the emergency department for evaluation of cough for the past 3 days. She reports cough has now become productive of green phlegm and she has had a low-grade fever. She does smoke cigarettes daily. She states that she's got some pain in her upper back from frequent cough. She has not taken any medications for the symptoms.   History reviewed. No pertinent past medical history.  Patient Active Problem List   Diagnosis Date Noted  . Dental abscess 06/05/2016    Past Surgical History:  Procedure Laterality Date  . CESAREAN SECTION    . cyst removed      Prior to Admission medications   Medication Sig Start Date End Date Taking? Authorizing Provider  acidophilus (RISAQUAD) CAPS capsule Take 1 capsule by mouth daily. 06/07/16   Enid BaasKalisetti, Radhika, MD  albuterol (PROVENTIL HFA;VENTOLIN HFA) 108 (90 Base) MCG/ACT inhaler Inhale 2 puffs into the lungs every 6 (six) hours as needed for wheezing or shortness of breath. 10/02/16   Shaylon Aden, Rulon Eisenmengerari B, FNP  azithromycin (ZITHROMAX) 250 MG tablet 2 tablets today, then 1 tablet for the next 4 days. 10/02/16   Celestino Ackerman, Rulon Eisenmengerari B, FNP  clindamycin (CLEOCIN) 300 MG capsule Take 1 capsule (300 mg total) by mouth 3 (three) times daily. X 7 more days 06/07/16   Enid BaasKalisetti, Radhika, MD  dexamethasone (DECADRON) 4 MG tablet Take 1 tablet (4 mg total) by mouth every 12 (twelve) hours. X 3 more days 06/07/16   Enid BaasKalisetti, Radhika, MD  guaiFENesin-codeine [facility](ROBITUSSIN AC) 100-10 MG/5ML syrup Take 5 mLs by mouth 3 (three) times daily as needed for cough. 10/02/16   Maryjean Corpening, Rulon Eisenmengerari B, FNP  HYDROcodone-acetaminophen (NORCO/VICODIN) 5-325 MG tablet Take 1-2 tablets by  mouth every 4 (four) hours as needed for moderate pain. 06/07/16   Enid BaasKalisetti, Radhika, MD  ibuprofen (ADVIL,MOTRIN) 600 MG tablet Take 1 tablet (600 mg total) by mouth every 6 (six) hours as needed. 05/26/16   Joni ReiningSmith, Ronald K, PA-C  lidocaine (XYLOCAINE) 2 % solution Use as directed 5 mLs in the mouth or throat every 6 (six) hours as needed for mouth pain. 05/26/16   Joni ReiningSmith, Ronald K, PA-C  lidocaine (XYLOCAINE) 2 % solution Use as directed 20 mLs in the mouth or throat as needed for mouth pain. 07/02/16   Rebecka ApleyWebster, Allison P, MD  predniSONE (DELTASONE) 10 MG tablet Take 5 tablets (50 mg total) by mouth daily. 10/02/16   Abeeha Twist, Rulon Eisenmengerari B, FNP  traMADol (ULTRAM) 50 MG tablet Take 1 tablet (50 mg total) by mouth every 6 (six) hours as needed. 07/02/16   Rebecka ApleyWebster, Allison P, MD    Allergies Patient has no known allergies.  No family history on file.  Social History Social History  Substance Use Topics  . Smoking status: Current Some Day Smoker    Types: Cigarettes  . Smokeless tobacco: Never Used  . Alcohol use Yes     Comment: once/week    Review of Systems Constitutional: Positive for fever/chills ENT: Positive for sore throat. Cardiovascular: Denies chest pain. Respiratory: Positive for shortness of breath. Positive for cough. Gastrointestinal: Negative for nausea,  no vomiting.  Negative for diarrhea.  Musculoskeletal: Negative for body aches Skin: Negative for rash. Neurological: Negative for headaches ____________________________________________  PHYSICAL EXAM:  VITAL SIGNS: ED Triage Vitals  Enc Vitals Group     BP 10/02/16 2110 135/79     Pulse Rate 10/02/16 2110 98     Resp 10/02/16 2110 16     Temp 10/02/16 2110 98 F (36.7 C)     Temp Source 10/02/16 2110 Oral     SpO2 10/02/16 2110 100 %     Weight 10/02/16 2111 250 lb (113.4 kg)     Height 10/02/16 2111 5' (1.524 m)     Head Circumference --      Peak Flow --      Pain Score 10/02/16 2110 9     Pain Loc --       Pain Edu? --      Excl. in GC? --     Constitutional: Alert and oriented. Acutely ill appearing and in no acute distress. Eyes: Conjunctivae are normal. Ears: Bilateral tympanic membranes are normal. Nose: No sinus congestion noted; no rhinnorhea. Mouth/Throat: Mucous membranes are moist.  Oropharynx mildly erythematous. Tonsils not visualized. Neck: No stridor.  Lymphatic: No cervical lymphadenopathy. Cardiovascular: Normal rate, regular rhythm. Good peripheral circulation. Respiratory: Normal respiratory effort.  No retractions. Expiratory wheezes noted throughout with good air movement. Gastrointestinal: Soft and nontender.  Musculoskeletal: FROM x 4 extremities.  Neurologic:  Normal speech and language.  Skin:  Skin is warm, dry and intact. No rash noted. Psychiatric: Mood and affect are normal. Speech and behavior are normal.  ____________________________________________   LABS (all labs ordered are listed, but only abnormal results are displayed)  Labs Reviewed - No data to display ____________________________________________  EKG  Not indicated ____________________________________________  RADIOLOGY  Not indicated ____________________________________________   PROCEDURES  Procedure(s) performed: None  Critical Care performed: No ____________________________________________   INITIAL IMPRESSION / ASSESSMENT AND PLAN / ED COURSE  31 year old female presenting to the emergency department for evaluation of productive cough, wheeze, and low-grade temperature. While in the emergency department she was given a DuoNeb with significant decrease in wheezing. She will be given prescriptions for azithromycin, Robitussin-AC, albuterol, and prednisone. She is encouraged follow-up with the primary care provider of her choice for symptoms that are not improving over the next few days. She was advised to return to the emergency department for symptoms that change or worsen if  she is unable schedule an appointment.  Pertinent labs & imaging results that were available during my care of the patient were reviewed by me and considered in my medical decision making (see chart for details).  Discharge Medication List as of 10/02/2016 11:31 PM    START taking these medications   Details  albuterol (PROVENTIL HFA;VENTOLIN HFA) 108 (90 Base) MCG/ACT inhaler Inhale 2 puffs into the lungs every 6 (six) hours as needed for wheezing or shortness of breath., Starting Tue 10/02/2016, Print    azithromycin (ZITHROMAX) 250 MG tablet 2 tablets today, then 1 tablet for the next 4 days., Print    guaiFENesin-codeine (ROBITUSSIN AC) 100-10 MG/5ML syrup Take 5 mLs by mouth 3 (three) times daily as needed for cough., Starting Tue 10/02/2016, Print    predniSONE (DELTASONE) 10 MG tablet Take 5 tablets (50 mg total) by mouth daily., Starting Tue 10/02/2016, Print        If controlled substance prescribed during this visit, 12 month history viewed on the [facility] prior to issuing an initial prescription for Schedule II or III opiod. ____________________________________________   FINAL CLINICAL IMPRESSION(S) / ED DIAGNOSES  Final diagnoses:  Acute bronchitis, unspecified  organism    Note:  This document was prepared using Conservation officer, historic buildings and may include unintentional dictation errors.     Chinita Pester, FNP 10/03/16 2025    Myrna Blazer, MD 10/03/16 [phone]

## 2017-01-05 ENCOUNTER — Encounter: Payer: Self-pay | Admitting: Emergency Medicine

## 2017-01-05 ENCOUNTER — Emergency Department
Admission: EM | Admit: 2017-01-05 | Discharge: 2017-01-05 | Disposition: A | Discharge status: Home / Self Care | Payer: Medicaid Other | Attending: Emergency Medicine | Admitting: Emergency Medicine

## 2017-01-05 ENCOUNTER — Emergency Department: Payer: Medicaid Other

## 2017-01-05 DIAGNOSIS — Z79899 Other long term (current) drug therapy: Secondary | ICD-10-CM | POA: Diagnosis not present

## 2017-01-05 DIAGNOSIS — M1711 Unilateral primary osteoarthritis, right knee: Secondary | ICD-10-CM | POA: Insufficient documentation

## 2017-01-05 DIAGNOSIS — M25561 Pain in right knee: Secondary | ICD-10-CM | POA: Diagnosis present

## 2017-01-05 DIAGNOSIS — F17210 Nicotine dependence, cigarettes, uncomplicated: Secondary | ICD-10-CM | POA: Insufficient documentation

## 2017-01-05 DIAGNOSIS — M25461 Effusion, right knee: Secondary | ICD-10-CM | POA: Insufficient documentation

## 2017-01-05 DIAGNOSIS — E860 Dehydration: Secondary | ICD-10-CM | POA: Insufficient documentation

## 2017-01-05 LAB — URINALYSIS, COMPLETE (UACMP) WITH MICROSCOPIC
Bacteria, UA: NONE SEEN
Bilirubin Urine: NEGATIVE
Glucose, UA: NEGATIVE mg/dL
Hgb urine dipstick: NEGATIVE
Ketones, ur: 5 mg/dL — AB
Leukocytes, UA: NEGATIVE
Nitrite: NEGATIVE
PH: 5 (ref 5.0–8.0)
PROTEIN: 30 mg/dL — AB
Specific Gravity, Urine: 1.041 — ABNORMAL HIGH (ref 1.005–1.030)

## 2017-01-05 LAB — POCT PREGNANCY, URINE: Preg Test, Ur: NEGATIVE

## 2017-01-05 NOTE — ED Provider Notes (Signed)
[facility] Emergency Department Provider Note  ____________________________________________  Time seen: Approximately 6:28 PM  I have reviewed the triage vital signs and the nursing notes.   HISTORY  Chief Complaint Knee Pain    HPI Stephanie Ballard is a 31 y.o. female that presents to the emergency department with right knee pain for one week and urinary urgency for "a while." Patient works at [employer] and is on her feet all day. She states that today her knee hurt so bad that yesterday she had to lift her leg into the car. She is concerned for fluid on her knee. She wears crocs daily. She also has urinary urgency. She is able to hold her bladder but when she has to go, she has to go quickly. She is concerned for an urinary tract infection. She drinks 7-8 [employer] per day. She states that she never drinks a glass of water. Shedenies fever, shortness of breath, chest pain, nausea, vomiting, abdominal pain, dysuria, frequency, calf pain, numbness, tingling.   History reviewed. No pertinent past medical history.  Patient Active Problem List   Diagnosis Date Noted  . Dental abscess 06/05/2016    Past Surgical History:  Procedure Laterality Date  . CESAREAN SECTION    . cyst removed      Prior to Admission medications   Medication Sig Start Date End Date Taking? Authorizing Provider  acidophilus (RISAQUAD) CAPS capsule Take 1 capsule by mouth daily. 06/07/16   Enid Baas, MD  albuterol (PROVENTIL HFA;VENTOLIN HFA) 108 (90 Base) MCG/ACT inhaler Inhale 2 puffs into the lungs every 6 (six) hours as needed for wheezing or shortness of breath. 10/02/16   Triplett, Rulon Eisenmenger B, FNP  azithromycin (ZITHROMAX) 250 MG tablet 2 tablets today, then 1 tablet for the next 4 days. 10/02/16   Triplett, Rulon Eisenmenger B, FNP  clindamycin (CLEOCIN) 300 MG capsule Take 1 capsule (300 mg total) by mouth 3 (three) times daily. X 7 more days 06/07/16   Enid Baas, MD  dexamethasone  (DECADRON) 4 MG tablet Take 1 tablet (4 mg total) by mouth every 12 (twelve) hours. X 3 more days 06/07/16   Enid Baas, MD  guaiFENesin-codeine [facility]) 100-10 MG/5ML syrup Take 5 mLs by mouth 3 (three) times daily as needed for cough. 10/02/16   Triplett, Rulon Eisenmenger B, FNP  HYDROcodone-acetaminophen (NORCO/VICODIN) 5-325 MG tablet Take 1-2 tablets by mouth every 4 (four) hours as needed for moderate pain. 06/07/16   Enid Baas, MD  ibuprofen (ADVIL,MOTRIN) 600 MG tablet Take 1 tablet (600 mg total) by mouth every 6 (six) hours as needed. 05/26/16   Joni Reining, PA-C  lidocaine (XYLOCAINE) 2 % solution Use as directed 5 mLs in the mouth or throat every 6 (six) hours as needed for mouth pain. 05/26/16   Joni Reining, PA-C  lidocaine (XYLOCAINE) 2 % solution Use as directed 20 mLs in the mouth or throat as needed for mouth pain. 07/02/16   Rebecka Apley, MD  predniSONE (DELTASONE) 10 MG tablet Take 5 tablets (50 mg total) by mouth daily. 10/02/16   Triplett, Rulon Eisenmenger B, FNP  traMADol (ULTRAM) 50 MG tablet Take 1 tablet (50 mg total) by mouth every 6 (six) hours as needed. 07/02/16   Rebecka Apley, MD    Allergies Patient has no known allergies.  History reviewed. No pertinent family history.  Social History Social History  Substance Use Topics  . Smoking status: Current Some Day Smoker    Types: Cigarettes  .  Smokeless tobacco: Never Used  . Alcohol use Yes     Comment: once/week     Review of Systems  Constitutional: No fever/chills Cardiovascular: No chest pain. Respiratory: No SOB. Gastrointestinal: No abdominal pain.  No nausea, no vomiting.  Musculoskeletal: Positive for knee pain. Skin: Negative for rash, abrasions, lacerations, ecchymosis. Neurological: Negative for headaches, numbness or tingling   ____________________________________________   PHYSICAL EXAM:  VITAL SIGNS: ED Triage Vitals [01/05/17 1635]  Enc Vitals Group     BP 126/85      Pulse Rate 88     Resp 18     Temp 98.2 F (36.8 C)     Temp Source Oral     SpO2 99 %     Weight 290 lb (131.5 kg)     Height 5' (1.524 m)     Head Circumference      Peak Flow      Pain Score 9     Pain Loc      Pain Edu?      Excl. in GC?      Constitutional: Alert and oriented. Well appearing and in no acute distress. Eyes: Conjunctivae are normal. PERRL. EOMI. Head: Atraumatic. ENT:      Ears:      Nose: No congestion/rhinnorhea.      Mouth/Throat: Mucous membranes are moist.  Neck: No stridor.   Cardiovascular: Normal rate, regular rhythm.  Good peripheral circulation. Palpable dorsalis pedis pulses. Respiratory: Normal respiratory effort without tachypnea or retractions. Lungs CTAB. Good air entry to the bases with no decreased or absent breath sounds. Musculoskeletal: Full range of motion to all extremities. No gross deformities appreciated. Tenderness to palpation over right patella. Full range of motion of knee. Neurologic:  Normal speech and language. No gross focal neurologic deficits are appreciated.  Skin:  Skin is warm, dry and intact. No rash noted.  ____________________________________________   LABS (all labs ordered are listed, but only abnormal results are displayed)  Labs Reviewed  URINALYSIS, COMPLETE (UACMP) WITH MICROSCOPIC - Abnormal; Notable for the following:       Result Value   Color, Urine AMBER (*)    APPearance HAZY (*)    Specific Gravity, Urine 1.041 (*)    Ketones, ur 5 (*)    Protein, ur 30 (*)    Squamous Epithelial / LPF 6-30 (*)    All other components within normal limits  POCT PREGNANCY, URINE   ____________________________________________  EKG   ____________________________________________  RADIOLOGY Lexine Baton, personally viewed and evaluated these images (plain radiographs) as part of my medical decision making, as well as reviewing the written report by the radiologist.  Dg Knee Complete 4 Views  Right  Result Date: 01/05/2017 CLINICAL DATA:  Pain for 1 week without trauma.  Swelling. EXAM: RIGHT KNEE - COMPLETE 4+ VIEW COMPARISON:  None. FINDINGS: Probable small suprapatellar effusion. No fractures or dislocations. Minimal degenerative changes in the medial and lateral compartment with tiny osteophytes. IMPRESSION: Probable small effusion.  Minimal degenerative changes. Electronically Signed   By: Gerome Sam III M.D   On: 01/05/2017 17:18    ____________________________________________    PROCEDURES  Procedure(s) performed:    Procedures    Medications - No data to display   ____________________________________________   INITIAL IMPRESSION / ASSESSMENT AND PLAN / ED COURSE  Pertinent labs & imaging results that were available during my care of the patient were reviewed by me and considered in my medical decision making (see chart for  details).  Review of the [place] CSRS was performed in accordance of the [facility] prior to dispensing any controlled drugs.  She presented to the emergency department for evaluation of right knee pain and urinary urgency. Vital signs and exam are reassuring. X-ray consistent with minimal osteoarthritis and small joint effusion. Knee was ace wrapped and crutches were given. No indication of infection on urinalysis. Patient drinks 7-8 cups of [facility] a day and no water. Education about caffeine and good hydration was given. Patient is to follow up with PCP as directed. Patient is given ED precautions to return to the ED for any worsening or new symptoms.     ____________________________________________  FINAL CLINICAL IMPRESSION(S) / ED DIAGNOSES  Final diagnoses:  Dehydration  Effusion of right knee  Osteoarthritis of right knee, unspecified osteoarthritis type      NEW MEDICATIONS STARTED DURING THIS VISIT:  Discharge Medication List as of 01/05/2017  7:03 PM          This chart was dictated using voice recognition  software/Dragon. Despite best efforts to proofread, errors can occur which can change the meaning. Any change was purely unintentional.    Enid Derry, PA-C 01/06/17 1612    Arnaldo Natal, MD 01/08/17 2116

## 2017-01-05 NOTE — ED Triage Notes (Signed)
Pt c/o right knee pain X 1 week without injury. Feels like fluid on knee per pt.  C/o swelling to right knee.  Hurts when walks.  Would like to be checked for UTI as well because hard to hold urine in per pt.

## 2017-10-02 ENCOUNTER — Other Ambulatory Visit: Payer: Self-pay

## 2017-10-02 DIAGNOSIS — R350 Frequency of micturition: Secondary | ICD-10-CM | POA: Insufficient documentation

## 2017-10-02 DIAGNOSIS — Z79899 Other long term (current) drug therapy: Secondary | ICD-10-CM | POA: Diagnosis not present

## 2017-10-02 DIAGNOSIS — F17210 Nicotine dependence, cigarettes, uncomplicated: Secondary | ICD-10-CM | POA: Insufficient documentation

## 2017-10-02 DIAGNOSIS — R109 Unspecified abdominal pain: Secondary | ICD-10-CM | POA: Insufficient documentation

## 2017-10-02 LAB — URINALYSIS, COMPLETE (UACMP) WITH MICROSCOPIC
BILIRUBIN URINE: NEGATIVE
Bacteria, UA: NONE SEEN
Glucose, UA: NEGATIVE mg/dL
KETONES UR: NEGATIVE mg/dL
LEUKOCYTES UA: NEGATIVE
NITRITE: NEGATIVE
Protein, ur: NEGATIVE mg/dL
Specific Gravity, Urine: 1.024 (ref 1.005–1.030)
pH: 6 (ref 5.0–8.0)

## 2017-10-02 LAB — POCT PREGNANCY, URINE: PREG TEST UR: NEGATIVE

## 2017-10-02 NOTE — ED Triage Notes (Signed)
Pt arrives to ED via POV from home with c/o urinary frequency x2 weeks. Pt also reports pain with urination and "white" vaginal d/c. Pt denies N/V/D, no fever.

## 2017-10-03 ENCOUNTER — Emergency Department
Admission: EM | Admit: 2017-10-03 | Discharge: 2017-10-03 | Disposition: A | Discharge status: Home / Self Care | Payer: Medicaid Other | Attending: Emergency Medicine | Admitting: Emergency Medicine

## 2017-10-03 DIAGNOSIS — R109 Unspecified abdominal pain: Secondary | ICD-10-CM

## 2017-10-03 DIAGNOSIS — R350 Frequency of micturition: Secondary | ICD-10-CM

## 2017-10-03 NOTE — ED Provider Notes (Signed)
[facility] Emergency Department Provider Note  ____________________________________________   First MD Initiated Contact with Patient 10/03/17 0231     (approximate)  I have reviewed the triage vital signs and the nursing notes.   HISTORY  Chief Complaint Urinary Frequency \  HPI Stephanie Ballard is a 32 y.o. female who self presents to the emergency department with increased urinary frequency for the past 2 weeks.  She also reports increased white vaginal discharge.  This is associated with mild to moderate cramping lower abdominal pain.  She has had no nausea or vomiting.  No diarrhea.  No fevers or chills.  No back pain.  No history of abdominal surgeries.  Nothing particular seems to make her symptoms better or worse.  History reviewed. No pertinent past medical history.  Patient Active Problem List   Diagnosis Date Noted  . Dental abscess 06/05/2016    Past Surgical History:  Procedure Laterality Date  . CESAREAN SECTION    . cyst removed      Prior to Admission medications   Medication Sig Start Date End Date Taking? Authorizing Provider  acidophilus (RISAQUAD) CAPS capsule Take 1 capsule by mouth daily. 06/07/16   Enid Baas, MD  albuterol (PROVENTIL HFA;VENTOLIN HFA) 108 (90 Base) MCG/ACT inhaler Inhale 2 puffs into the lungs every 6 (six) hours as needed for wheezing or shortness of breath. 10/02/16   Triplett, Rulon Eisenmenger B, FNP  azithromycin (ZITHROMAX) 250 MG tablet 2 tablets today, then 1 tablet for the next 4 days. 10/02/16   Triplett, Rulon Eisenmenger B, FNP  clindamycin (CLEOCIN) 300 MG capsule Take 1 capsule (300 mg total) by mouth 3 (three) times daily. X 7 more days 06/07/16   Enid Baas, MD  dexamethasone (DECADRON) 4 MG tablet Take 1 tablet (4 mg total) by mouth every 12 (twelve) hours. X 3 more days 06/07/16   Enid Baas, MD  guaiFENesin-codeine [facility]) 100-10 MG/5ML syrup Take 5 mLs by mouth 3 (three) times daily as  needed for cough. 10/02/16   Triplett, Rulon Eisenmenger B, FNP  HYDROcodone-acetaminophen (NORCO/VICODIN) 5-325 MG tablet Take 1-2 tablets by mouth every 4 (four) hours as needed for moderate pain. 06/07/16   Enid Baas, MD  ibuprofen (ADVIL,MOTRIN) 600 MG tablet Take 1 tablet (600 mg total) by mouth every 6 (six) hours as needed. 05/26/16   Joni Reining, PA-C  lidocaine (XYLOCAINE) 2 % solution Use as directed 5 mLs in the mouth or throat every 6 (six) hours as needed for mouth pain. 05/26/16   Joni Reining, PA-C  lidocaine (XYLOCAINE) 2 % solution Use as directed 20 mLs in the mouth or throat as needed for mouth pain. 07/02/16   Rebecka Apley, MD  predniSONE (DELTASONE) 10 MG tablet Take 5 tablets (50 mg total) by mouth daily. 10/02/16   Triplett, Rulon Eisenmenger B, FNP  traMADol (ULTRAM) 50 MG tablet Take 1 tablet (50 mg total) by mouth every 6 (six) hours as needed. 07/02/16   Rebecka Apley, MD    Allergies Patient has no known allergies.  No family history on file.  Social History Social History   Tobacco Use  . Smoking status: Current Some Day Smoker    Types: Cigarettes  . Smokeless tobacco: Never Used  Substance Use Topics  . Alcohol use: Yes    Comment: once/week  . Drug use: No    Review of Systems Constitutional: No fever/chills ENT: No sore throat. Cardiovascular: Denies chest pain. Respiratory: Denies shortness of breath. Gastrointestinal:  Positive for abdominal pain.  No nausea, no vomiting.  No diarrhea.  No constipation. Musculoskeletal: Negative for back pain. Neurological: Negative for headaches   ____________________________________________   PHYSICAL EXAM:  VITAL SIGNS: ED Triage Vitals  Enc Vitals Group     BP 10/02/17 2243 120/66     Pulse Rate 10/02/17 2243 93     Resp 10/02/17 2243 18     Temp 10/02/17 2243 98.4 F (36.9 C)     Temp Source 10/02/17 2243 Oral     SpO2 10/02/17 2243 99 %     Weight 10/02/17 2241 290 lb (131.5 kg)     Height 10/02/17  2241 5' (1.524 m)     Head Circumference --      Peak Flow --      Pain Score 10/02/17 2241 0     Pain Loc --      Pain Edu? --      Excl. in GC? --     Constitutional: Alert and oriented x4 pleasant cooperative speaks full clear sentences no diaphoresis Head: Atraumatic. Nose: No congestion/rhinnorhea. Mouth/Throat: No trismus Neck: No stridor.   Cardiovascular: Regular rate and rhythm Respiratory: Normal respiratory effort.  No retractions. Neurologic:  Normal speech and language. No gross focal neurologic deficits are appreciated.  Skin:  Skin is warm, dry and intact. No rash noted.    ____________________________________________  LABS (all labs ordered are listed, but only abnormal results are displayed)  Labs Reviewed  URINALYSIS, COMPLETE (UACMP) WITH MICROSCOPIC - Abnormal; Notable for the following components:      Result Value   Color, Urine YELLOW (*)    APPearance HAZY (*)    Hgb urine dipstick SMALL (*)    All other components within normal limits  POCT PREGNANCY, URINE    Lab work reviewed by me with no evidence of urinary tract infection __________________________________________  EKG   ____________________________________________  RADIOLOGY   ____________________________________________   DIFFERENTIAL includes but not limited to  UTI, pyelonephritis, gonorrhea, chlamydia, pelvic inflammatory disease, appendicitis, diverticulitis   PROCEDURES  Procedure(s) performed: no  Procedures  Critical Care performed: no  Observation: no ____________________________________________   INITIAL IMPRESSION / ASSESSMENT AND PLAN / ED COURSE  Pertinent labs & imaging results that were available during my care of the patient were reviewed by me and considered in my medical decision making (see chart for details).       ----------------------------------------- 2:41 AM on 10/03/2017 -----------------------------------------  Mom is reporting  urinary frequency along with increased vaginal discharge and abdominal pain.  I recommended blood work to evaluate for possible diabetes as well as a pelvic exam to evaluate for cervicitis versus pelvic inflammatory disease.  Her son is tired and wants to go home and the patient declines further work-up at this time. ____________________________________________   FINAL CLINICAL IMPRESSION(S) / ED DIAGNOSES  Final diagnoses:  Urinary frequency  Abdominal pain, unspecified abdominal location      NEW MEDICATIONS STARTED DURING THIS VISIT:  Discharge Medication List as of 10/03/2017  2:41 AM       Note:  This document was prepared using Dragon voice recognition software and may include unintentional dictation errors.      Merrily Brittle, MD 10/03/17 2328

## 2017-10-03 NOTE — Discharge Instructions (Signed)
Today I recommended blood work and a pelvic exam to fully evaluate the etiology of your symptoms, however you declined.  Please know that you are more than welcome to return to our ED at any point and I am happy to continue your care.  Return for any issues whatsoever.  It was a pleasure to take care of you today, and thank you for coming to our emergency department.  If you have any questions or concerns before leaving please ask the nurse to grab me and I'm more than happy to go through your aftercare instructions again.  If you were prescribed any opioid pain medication today such as Norco, Vicodin, Percocet, morphine, hydrocodone, or oxycodone please make sure you do not drive when you are taking this medication as it can alter your ability to drive safely.  If you have any concerns once you are home that you are not improving or are in fact getting worse before you can make it to your follow-up appointment, please do not hesitate to call 911 and come back for further evaluation.  Merrily Brittle, MD  Results for orders placed or performed during the hospital encounter of 10/03/17  Urinalysis, Complete w Microscopic  Result Value Ref Range   Color, Urine YELLOW (A) YELLOW   APPearance HAZY (A) CLEAR   Specific Gravity, Urine 1.024 1.005 - 1.030   pH 6.0 5.0 - 8.0   Glucose, UA NEGATIVE NEGATIVE mg/dL   Hgb urine dipstick SMALL (A) NEGATIVE   Bilirubin Urine NEGATIVE NEGATIVE   Ketones, ur NEGATIVE NEGATIVE mg/dL   Protein, ur NEGATIVE NEGATIVE mg/dL   Nitrite NEGATIVE NEGATIVE   Leukocytes, UA NEGATIVE NEGATIVE   RBC / HPF 0-5 0 - 5 RBC/hpf   WBC, UA 0-5 0 - 5 WBC/hpf   Bacteria, UA NONE SEEN NONE SEEN   Squamous Epithelial / LPF 6-10 0 - 5  Pregnancy, urine POC  Result Value Ref Range   Preg Test, Ur NEGATIVE NEGATIVE

## 2017-10-10 IMAGING — CT CT MAXILLOFACIAL W/ CM
3 series · 15 of 47 positions shown, 18 images · IV contrast (iopamidol)
Comparison: None.

CLINICAL DATA: Right facial pain and swelling which have worsened
over the past week. The patient reports a toothache.

EXAM:
CT MAXILLOFACIAL WITH CONTRAST
TECHNIQUE: Multidetector CT imaging of the maxillofacial structures was
performed. Multiplanar CT image reconstructions were also generated.
A small metallic BB was placed on the right temple in order to
reliably differentiate right from left.
CONTRAST:  75 ml X3C10Z-OGG IOPAMIDOL (X3C10Z-OGG) INJECTION 61%

[Series 2: max soft · axial · 0.36mm/px · z∈[-118,+36]mm · 9 of 91 slices shown, 12 images]
[im 7/91  brain]
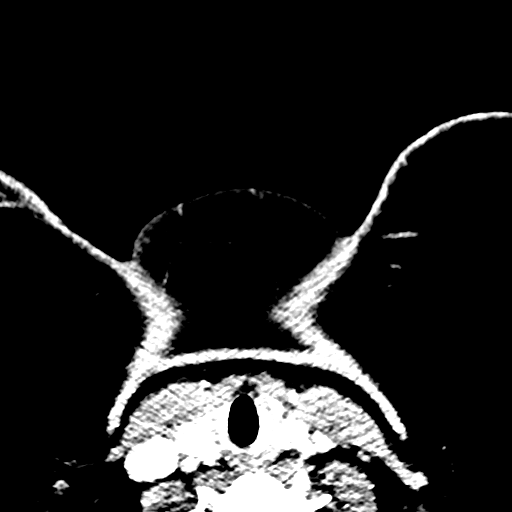
[im 7/91  bone]
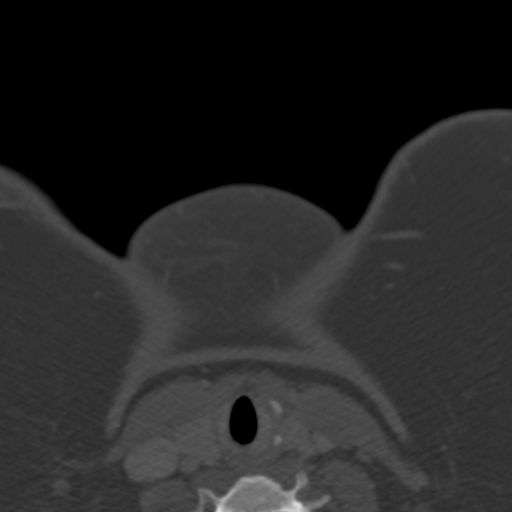
[im 16/91  bone]
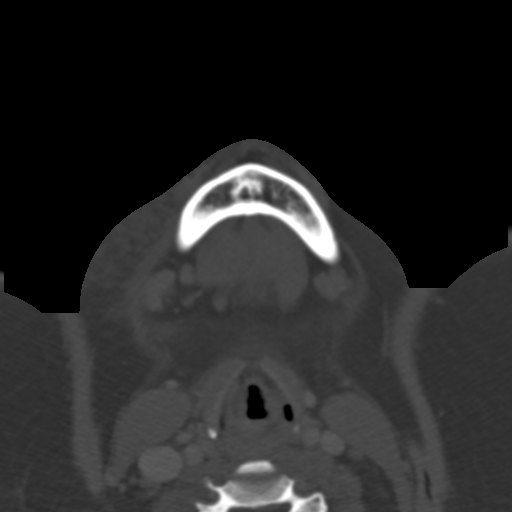
[im 25/91  bone]
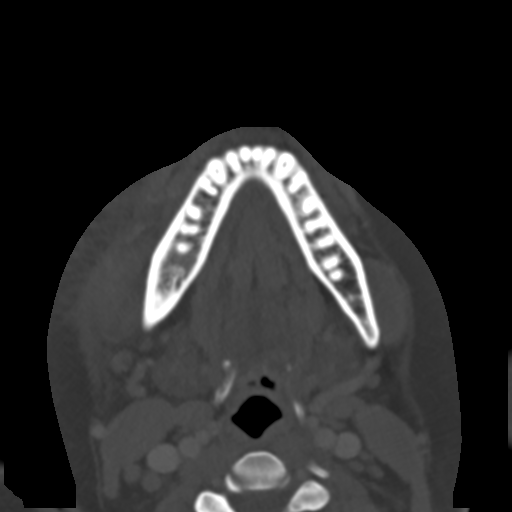
[im 35/91  bone]
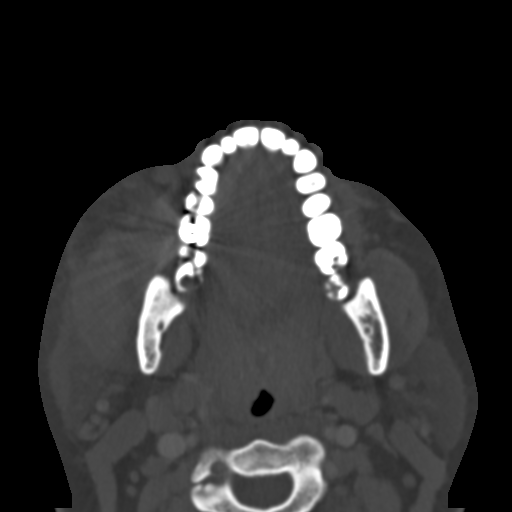
[im 47/91  brain]
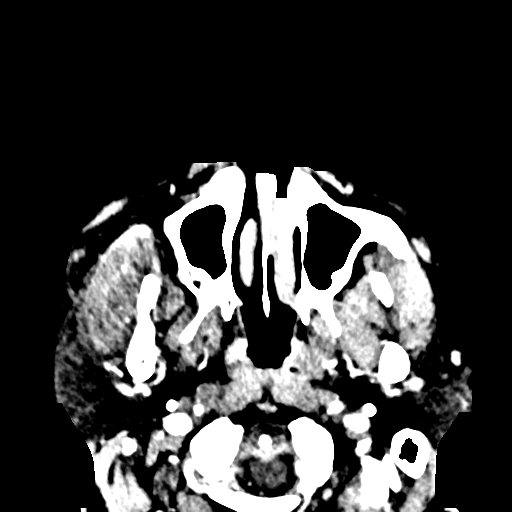
[im 47/91  bone]
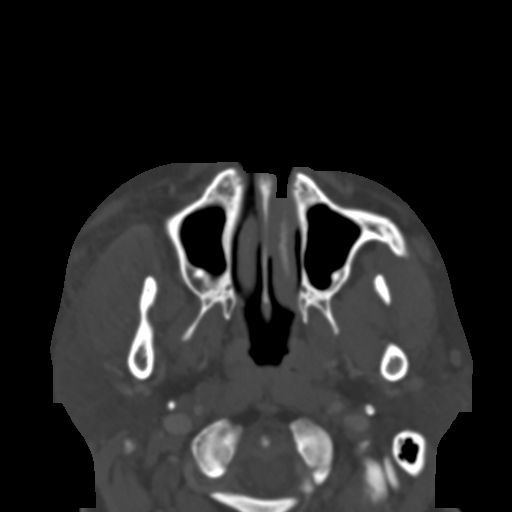
[im 56/91  bone]
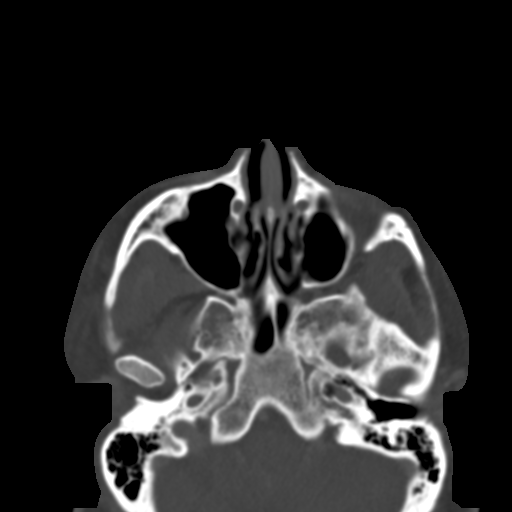
[im 66/91  bone]
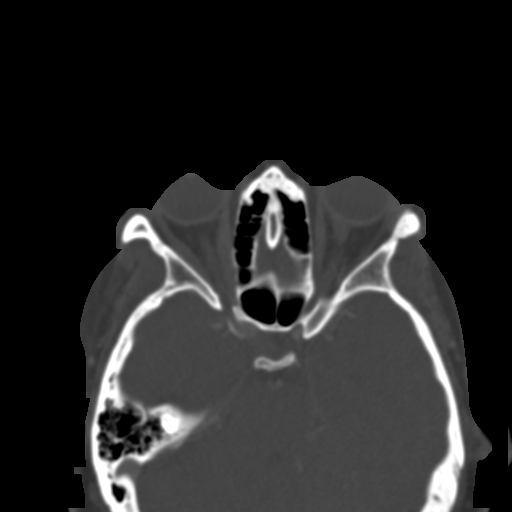
[im 75/91  bone]
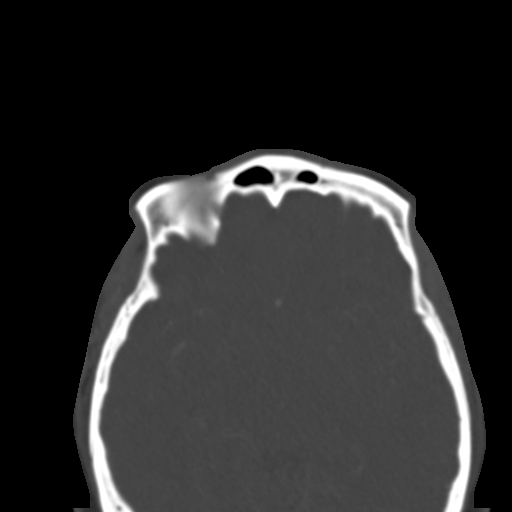
[im 84/91  brain]
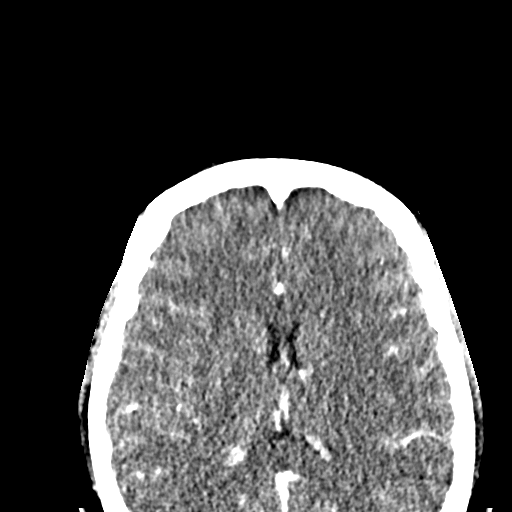
[im 84/91  bone]
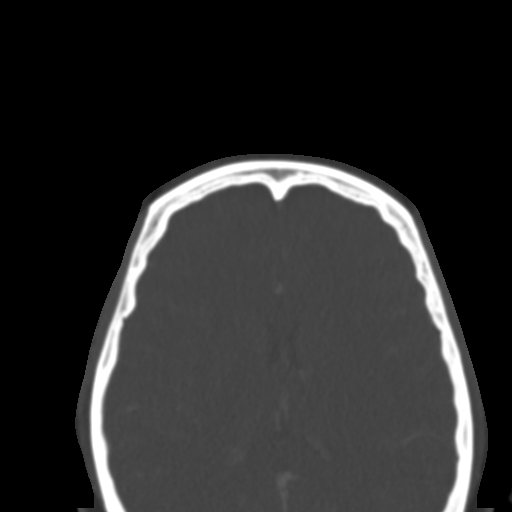

[Series 6: coronal soft · coronal · 0.37mm/px · 3 of 69 slices shown]
[im 23/69  bone]
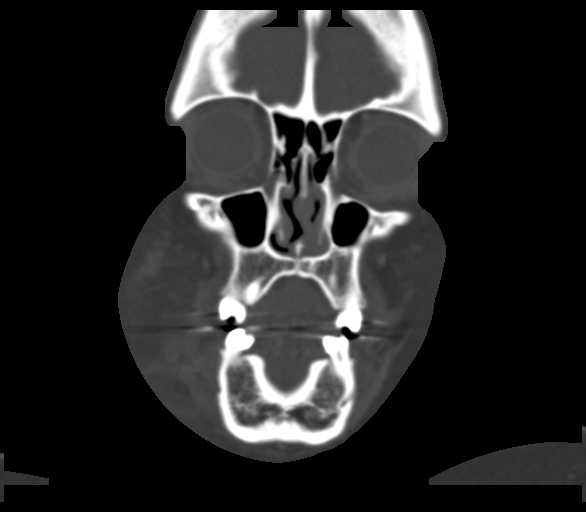
[im 31/69  bone]
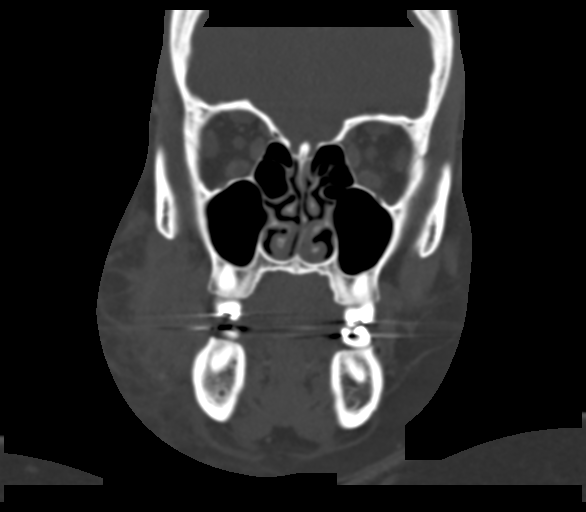
[im 38/69  bone]
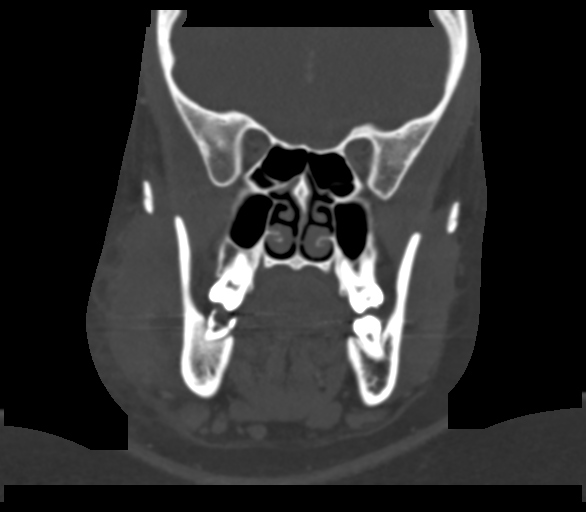

[Series 8: sagittal soft · sagittal · 0.35mm/px · 3 of 93 slices shown]
[im 31/93  bone]
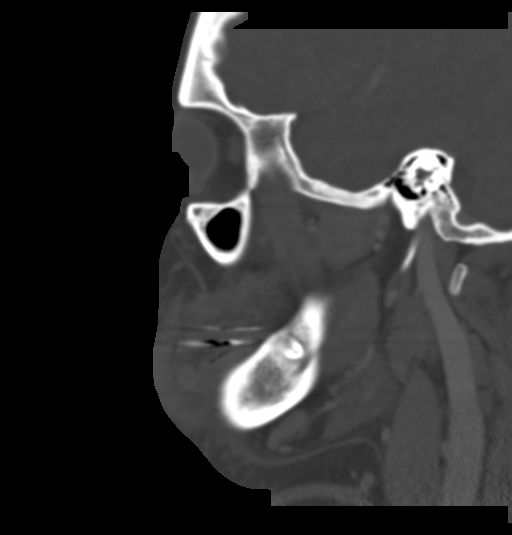
[im 47/93  bone]
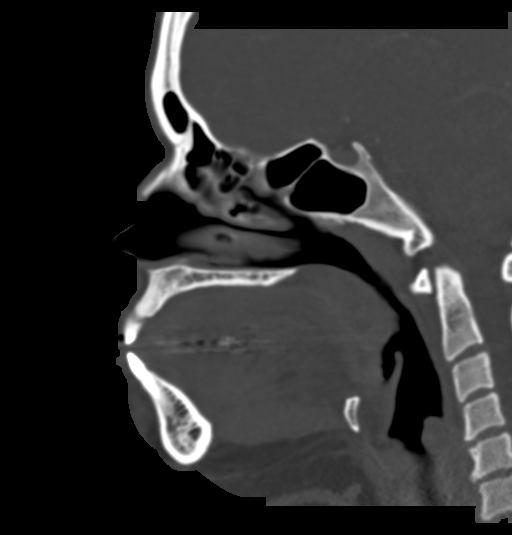
[im 62/93  bone]
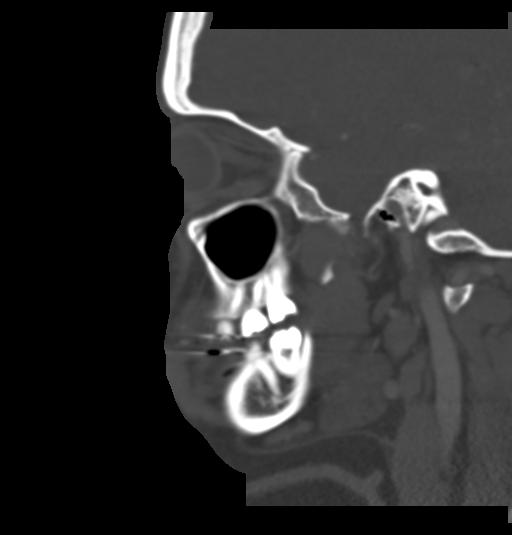

[15 of 47 positions shown; findings below may reference images not displayed]

FINDINGS: Osseous: There is periapical lucency about the posterior most molar
in the right mandible consistent abscess. There is a large cavity in
the molar.

Orbits: Negative.

Sinuses: Negative.

Soft tissues: Stranding in subcutaneous fatty tissues of the right
side of the face is consistent with cellulitis. A subperiosteal
abscess is seen along the right mandible measuring 2.6 cm AP x
cm transverse x 1.9 cm craniocaudal. There is myositis of the right
masseter muscle belly and an intramuscular abscess which
communicates with the main abscess described above. Intramuscular
component measures 1 cm AP x 0.8 cm transverse x 1.6 cm
craniocaudal.

Limited intracranial: Negative.
IMPRESSION: Cellulitis about the right face with a subperiosteal abscess along
the posterior body of the right mandible. The abscess extends into
the right masseter muscle consistent with pyomyositis. The abscess
in the emanates from a large cavity and subperiosteal abscess in the
posterior most right mandibular molar.

## 2018-05-13 ENCOUNTER — Other Ambulatory Visit: Payer: Self-pay

## 2018-05-13 DIAGNOSIS — F17210 Nicotine dependence, cigarettes, uncomplicated: Secondary | ICD-10-CM | POA: Insufficient documentation

## 2018-05-13 DIAGNOSIS — Z79899 Other long term (current) drug therapy: Secondary | ICD-10-CM | POA: Insufficient documentation

## 2018-05-13 DIAGNOSIS — J020 Streptococcal pharyngitis: Secondary | ICD-10-CM | POA: Insufficient documentation

## 2018-05-13 NOTE — ED Triage Notes (Signed)
Pt states she has strep throat and a fever.

## 2018-05-14 ENCOUNTER — Emergency Department
Admission: EM | Admit: 2018-05-14 | Discharge: 2018-05-14 | Disposition: A | Discharge status: Home / Self Care | Payer: Medicaid Other | Attending: Emergency Medicine | Admitting: Emergency Medicine

## 2018-05-14 DIAGNOSIS — J020 Streptococcal pharyngitis: Secondary | ICD-10-CM

## 2018-05-14 DIAGNOSIS — J039 Acute tonsillitis, unspecified: Secondary | ICD-10-CM

## 2018-05-14 DIAGNOSIS — J029 Acute pharyngitis, unspecified: Secondary | ICD-10-CM

## 2018-05-14 LAB — GROUP A STREP BY PCR: Group A Strep by PCR: DETECTED — AB

## 2018-05-14 MED ORDER — AMOXICILLIN 500 MG PO CAPS
500.0000 mg | ORAL_CAPSULE | Freq: Once | ORAL | Status: AC
  Administered 2018-05-14: 500 mg via ORAL
  Filled 2018-05-14: qty 1

## 2018-05-14 MED ORDER — DEXAMETHASONE SODIUM PHOSPHATE 10 MG/ML IJ SOLN
10.0000 mg | Freq: Once | INTRAMUSCULAR | Status: AC
  Administered 2018-05-14: 10 mg via INTRAMUSCULAR
  Filled 2018-05-14: qty 1

## 2018-05-14 MED ORDER — AMOXICILLIN 500 MG PO CAPS: 500.0000 mg | ORAL_CAPSULE | Freq: Three times a day (TID) | ORAL | 0 refills | Status: DC

## 2018-05-14 MED ORDER — MAGIC MOUTHWASH: 5.0000 mL | Freq: Three times a day (TID) | ORAL | 0 refills | Status: DC | PRN

## 2018-05-14 MED ORDER — MAGIC MOUTHWASH
10.0000 mL | Freq: Once | ORAL | Status: AC
  Administered 2018-05-14: 10 mL via ORAL
  Filled 2018-05-14: qty 10

## 2018-05-14 NOTE — ED Notes (Signed)
ED Provider at bedside. 

## 2018-05-14 NOTE — ED Notes (Signed)
Pt waiting on ride to get here at this time. Pt informed this RN that ride would be here in about 15 minutes.

## 2018-05-14 NOTE — ED Provider Notes (Signed)
[facility] Emergency Department Provider Note   ____________________________________________   First MD Initiated Contact with Patient 05/14/18 0121     (approximate)  I have reviewed the triage vital signs and the nursing notes.   HISTORY  Chief Complaint Sore Throat    HPI Stephanie Ramaiffany S Ballard is a 32 y.o. female who presents to the ED from home with a chief complaint of sore throat.  Patient reports a 1 day history of sore throat with subjective fevers.  Also complains of nonproductive cough.  Denies associated chills, chest pain, shortness of breath, abdominal pain, nausea or vomiting.  Denies recent travel or trauma.   Past medical history None  Patient Active Problem List   Diagnosis Date Noted  . Dental abscess 06/05/2016    Past Surgical History:  Procedure Laterality Date  . CESAREAN SECTION    . cyst removed      Prior to Admission medications   Medication Sig Start Date End Date Taking? Authorizing Provider  acidophilus (RISAQUAD) CAPS capsule Take 1 capsule by mouth daily. 06/07/16   Enid BaasKalisetti, Radhika, MD  albuterol (PROVENTIL HFA;VENTOLIN HFA) 108 (90 Base) MCG/ACT inhaler Inhale 2 puffs into the lungs every 6 (six) hours as needed for wheezing or shortness of breath. 10/02/16   Triplett, Rulon Eisenmengerari B, FNP  amoxicillin (AMOXIL) 500 MG capsule Take 1 capsule (500 mg total) by mouth 3 (three) times daily. 05/14/18   Irean HongSung, Jade J, MD  azithromycin (ZITHROMAX) 250 MG tablet 2 tablets today, then 1 tablet for the next 4 days. 10/02/16   Triplett, Rulon Eisenmengerari B, FNP  clindamycin (CLEOCIN) 300 MG capsule Take 1 capsule (300 mg total) by mouth 3 (three) times daily. X 7 more days 06/07/16   Enid BaasKalisetti, Radhika, MD  dexamethasone (DECADRON) 4 MG tablet Take 1 tablet (4 mg total) by mouth every 12 (twelve) hours. X 3 more days 06/07/16   Enid BaasKalisetti, Radhika, MD  guaiFENesin-codeine [facility](ROBITUSSIN AC) 100-10 MG/5ML syrup Take 5 mLs by mouth 3 (three) times daily as  needed for cough. 10/02/16   Triplett, Rulon Eisenmengerari B, FNP  HYDROcodone-acetaminophen (NORCO/VICODIN) 5-325 MG tablet Take 1-2 tablets by mouth every 4 (four) hours as needed for moderate pain. 06/07/16   Enid BaasKalisetti, Radhika, MD  ibuprofen (ADVIL,MOTRIN) 600 MG tablet Take 1 tablet (600 mg total) by mouth every 6 (six) hours as needed. 05/26/16   Joni ReiningSmith, Ronald K, PA-C  lidocaine (XYLOCAINE) 2 % solution Use as directed 5 mLs in the mouth or throat every 6 (six) hours as needed for mouth pain. 05/26/16   Joni ReiningSmith, Ronald K, PA-C  lidocaine (XYLOCAINE) 2 % solution Use as directed 20 mLs in the mouth or throat as needed for mouth pain. 07/02/16   Rebecka ApleyWebster, Allison P, MD  magic mouthwash SOLN Take 5 mLs by mouth 3 (three) times daily as needed for mouth pain. 05/14/18   Irean HongSung, Jade J, MD  predniSONE (DELTASONE) 10 MG tablet Take 5 tablets (50 mg total) by mouth daily. 10/02/16   Triplett, Rulon Eisenmengerari B, FNP  traMADol (ULTRAM) 50 MG tablet Take 1 tablet (50 mg total) by mouth every 6 (six) hours as needed. 07/02/16   Rebecka ApleyWebster, Allison P, MD    Allergies Patient has no known allergies.  No family history on file.  Social History Social History   Tobacco Use  . Smoking status: Current Some Day Smoker    Types: Cigarettes  . Smokeless tobacco: Never Used  Substance Use Topics  . Alcohol use: Yes  Comment: once/week  . Drug use: No    Review of Systems  Constitutional: Positive for subjective fever. Eyes: No visual changes. ENT: Positive for sore throat. Cardiovascular: Denies chest pain. Respiratory: Positive for nonproductive cough.  Denies shortness of breath. Gastrointestinal: No abdominal pain.  No nausea, no vomiting.  No diarrhea.  No constipation. Genitourinary: Negative for dysuria. Musculoskeletal: Negative for back pain. Skin: Negative for rash. Neurological: Negative for headaches, focal weakness or numbness.   ____________________________________________   PHYSICAL EXAM:  VITAL SIGNS: ED  Triage Vitals  Enc Vitals Group     BP 05/14/18 0000 132/84     Pulse Rate 05/14/18 0000 95     Resp 05/14/18 0000 18     Temp 05/14/18 0000 98.1 F (36.7 C)     Temp Source 05/14/18 0000 Oral     SpO2 05/14/18 0000 100 %     Weight 05/13/18 2357 280 lb (127 kg)     Height 05/13/18 2357 5\' 3"  (1.6 m)     Head Circumference --      Peak Flow --      Pain Score 05/13/18 2357 9     Pain Loc --      Pain Edu? --      Excl. in GC? --     Constitutional: Alert and oriented. Well appearing and in no acute distress. Eyes: Conjunctivae are normal. PERRL. EOMI. Head: Atraumatic. Ears: Bilateral TM dullness. Nose: Congestion/rhinnorhea. Mouth/Throat: Mucous membranes are moist.  Oropharynx moderately erythematous with symmetrically swollen tonsils with exudates.  No peritonsillar abscess.  There is no hoarse or muffled voice.  There is no drooling. Neck: No stridor.  Supple neck without meningismus. Hematological/Lymphatic/Immunilogical: Shotty anterior cervical lymphadenopathy. Cardiovascular: Normal rate, regular rhythm. Grossly normal heart sounds.  Good peripheral circulation. Respiratory: Normal respiratory effort.  No retractions. Lungs CTAB. Gastrointestinal: Soft and nontender. No distention. No abdominal bruits. No CVA tenderness. Musculoskeletal: No lower extremity tenderness nor edema.  No joint effusions. Neurologic:  Normal speech and language. No gross focal neurologic deficits are appreciated. No gait instability. Skin:  Skin is warm, dry and intact. No rash noted. Psychiatric: Mood and affect are normal. Speech and behavior are normal.  ____________________________________________   LABS (all labs ordered are listed, but only abnormal results are displayed)  Labs Reviewed  GROUP A STREP BY PCR - Abnormal; Notable for the following components:      Result Value   Group A Strep by PCR DETECTED (*)    All other components within normal limits    ____________________________________________  EKG  None ____________________________________________  RADIOLOGY  ED MD interpretation: None  Official radiology report(s): No results found.  ____________________________________________   PROCEDURES  Procedure(s) performed: None  Procedures  Critical Care performed: No  ____________________________________________   INITIAL IMPRESSION / ASSESSMENT AND PLAN / ED COURSE  As part of my medical decision making, I reviewed the following data within the electronic MEDICAL RECORD NUMBER Nursing notes reviewed and incorporated, Labs reviewed and Notes from prior ED visits   32 year old female who presents with sore throat.  Rapid strep is positive.  Evidence of tonsillitis on clinical exam.  Will administer IM Decadron, start Amoxicillin and Magic mouthwash as needed.  Strict return precautions given.  Patient verbalizes understanding and agrees with plan of care.      ____________________________________________   FINAL CLINICAL IMPRESSION(S) / ED DIAGNOSES  Final diagnoses:  Strep throat  Sore throat  Tonsillitis     ED Discharge Orders  Ordered    amoxicillin (AMOXIL) 500 MG capsule  3 times daily     05/14/18 0128    magic mouthwash SOLN  3 times daily PRN     05/14/18 0128           Note:  This document was prepared using Dragon voice recognition software and may include unintentional dictation errors.    Irean Hong, MD 05/14/18 [phone]

## 2018-05-14 NOTE — Discharge Instructions (Signed)
1.  Take antibiotic as prescribed (Amoxicillin 500 mg 3 times daily x7 days). 2.  You may use Magic mouthwash 3 times daily as needed for throat discomfort. 3.  Return to the ER for worsening symptoms, persistent vomiting, difficulty breathing or other concerns.

## 2018-07-04 ENCOUNTER — Emergency Department
Admission: EM | Admit: 2018-07-04 | Discharge: 2018-07-04 | Disposition: A | Discharge status: Home / Self Care | Payer: Self-pay | Attending: Emergency Medicine | Admitting: Emergency Medicine

## 2018-07-04 ENCOUNTER — Other Ambulatory Visit: Payer: Self-pay

## 2018-07-04 DIAGNOSIS — Z79899 Other long term (current) drug therapy: Secondary | ICD-10-CM | POA: Insufficient documentation

## 2018-07-04 DIAGNOSIS — F17210 Nicotine dependence, cigarettes, uncomplicated: Secondary | ICD-10-CM | POA: Insufficient documentation

## 2018-07-04 DIAGNOSIS — B9689 Other specified bacterial agents as the cause of diseases classified elsewhere: Secondary | ICD-10-CM | POA: Insufficient documentation

## 2018-07-04 DIAGNOSIS — Z202 Contact with and (suspected) exposure to infections with a predominantly sexual mode of transmission: Secondary | ICD-10-CM

## 2018-07-04 DIAGNOSIS — N760 Acute vaginitis: Secondary | ICD-10-CM | POA: Insufficient documentation

## 2018-07-04 DIAGNOSIS — A5900 Urogenital trichomoniasis, unspecified: Secondary | ICD-10-CM | POA: Insufficient documentation

## 2018-07-04 DIAGNOSIS — A599 Trichomoniasis, unspecified: Secondary | ICD-10-CM

## 2018-07-04 LAB — POCT PREGNANCY, URINE: Preg Test, Ur: NEGATIVE

## 2018-07-04 LAB — URINALYSIS, COMPLETE (UACMP) WITH MICROSCOPIC
Bacteria, UA: NONE SEEN
Bilirubin Urine: NEGATIVE
Glucose, UA: NEGATIVE mg/dL
Ketones, ur: NEGATIVE mg/dL
Nitrite: NEGATIVE
Protein, ur: NEGATIVE mg/dL
Specific Gravity, Urine: 1.03 (ref 1.005–1.030)
pH: 5 (ref 5.0–8.0)

## 2018-07-04 LAB — WET PREP, GENITAL
Sperm: NONE SEEN
Yeast Wet Prep HPF POC: NONE SEEN

## 2018-07-04 LAB — CHLAMYDIA/NGC RT PCR (ARMC ONLY)
CHLAMYDIA TR: NOT DETECTED
N GONORRHOEAE: NOT DETECTED

## 2018-07-04 MED ORDER — METRONIDAZOLE 500 MG PO TABS
500.0000 mg | ORAL_TABLET | Freq: Once | ORAL | Status: AC
  Administered 2018-07-04: 500 mg via ORAL
  Filled 2018-07-04: qty 1

## 2018-07-04 MED ORDER — CEFTRIAXONE SODIUM 250 MG IJ SOLR
250.0000 mg | Freq: Once | INTRAMUSCULAR | Status: AC
  Administered 2018-07-04: 250 mg via INTRAMUSCULAR
  Filled 2018-07-04: qty 250

## 2018-07-04 MED ORDER — METRONIDAZOLE 500 MG PO TABS: 500.0000 mg | ORAL_TABLET | Freq: Two times a day (BID) | ORAL | 0 refills | Status: AC

## 2018-07-04 MED ORDER — AZITHROMYCIN 500 MG PO TABS
1000.0000 mg | ORAL_TABLET | Freq: Once | ORAL | Status: AC
  Administered 2018-07-04: 1000 mg via ORAL
  Filled 2018-07-04: qty 2

## 2018-07-04 NOTE — ED Provider Notes (Signed)
[facility] Emergency Department Provider Note ____________________________________________  Time seen: 1011  I have reviewed the triage vital signs and the nursing notes.  HISTORY  Chief Complaint  Vaginal Discharge  HPI Stephanie Ballard is a 33 y.o. female presents herself to the ED for evaluation of vaginal discharge.  Patient is also concerned for exposure to chlamydia via her female partner.  She reports some pelvic discomfort over the last several days.  She denies any abnormal vaginal bleeding, nausea, vomiting, or dizziness.  History reviewed. No pertinent past medical history.  Patient Active Problem List   Diagnosis Date Noted  . Dental abscess 06/05/2016    Past Surgical History:  Procedure Laterality Date  . CESAREAN SECTION    . cyst removed      Prior to Admission medications   Medication Sig Start Date End Date Taking? Authorizing Provider  acidophilus (RISAQUAD) CAPS capsule Take 1 capsule by mouth daily. 06/07/16   Enid Baas, MD  metroNIDAZOLE (FLAGYL) 500 MG tablet Take 1 tablet (500 mg total) by mouth 2 (two) times daily for 7 days. 07/04/18 07/11/18  Gurjit Loconte, Charlesetta Ivory, PA-C    Allergies Patient has no known allergies.  History reviewed. No pertinent family history.  Social History Social History   Tobacco Use  . Smoking status: Current Some Day Smoker    Types: Cigarettes  . Smokeless tobacco: Never Used  Substance Use Topics  . Alcohol use: Yes    Comment: once/week  . Drug use: No    Review of Systems  Constitutional: Negative for fever. Eyes: Negative for visual changes. ENT: Negative for sore throat. Cardiovascular: Negative for chest pain. Respiratory: Negative for shortness of breath. Gastrointestinal: Negative for abdominal pain, vomiting and diarrhea. Genitourinary: Negative for dysuria. Musculoskeletal: Negative for back pain. Skin: Negative for rash. Neurological: Negative for headaches, focal  weakness or numbness. ____________________________________________  PHYSICAL EXAM:  VITAL SIGNS: ED Triage Vitals  Enc Vitals Group     BP 07/04/18 0930 125/87     Pulse Rate 07/04/18 0930 99     Resp --      Temp 07/04/18 0930 98.3 F (36.8 C)     Temp Source 07/04/18 0930 Oral     SpO2 07/04/18 0930 100 %     Weight 07/04/18 0930 270 lb (122.5 kg)     Height 07/04/18 0930 5' (1.524 m)     Head Circumference --      Peak Flow --      Pain Score 07/04/18 0937 7     Pain Loc --      Pain Edu? --      Excl. in GC? --     Constitutional: Alert and oriented. Well appearing and in no distress. Head: Normocephalic and atraumatic. Cardiovascular: Normal rate, regular rhythm. Normal distal pulses. Respiratory: Normal respiratory effort.  GU: Normal external genitalia.  Patient with a frothy white discharge noted in the vagina.  There is no significant cervical motion tenderness or adnexal masses. Musculoskeletal: Nontender with normal range of motion in all extremities.  Neurologic:  Normal gait without ataxia. Normal speech and language. No gross focal neurologic deficits are appreciated. Skin:  Skin is warm, dry and intact. No rash noted. ____________________________________________   LABS (pertinent positives/negatives) Labs Reviewed  WET PREP, GENITAL - Abnormal; Notable for the following components:      Result Value   Trich, Wet Prep PRESENT (*)    Clue Cells Wet Prep HPF POC PRESENT (*)  WBC, Wet Prep HPF POC MODERATE (*)    All other components within normal limits  URINALYSIS, COMPLETE (UACMP) WITH MICROSCOPIC - Abnormal; Notable for the following components:   Color, Urine YELLOW (*)    APPearance CLOUDY (*)    Hgb urine dipstick MODERATE (*)    Leukocytes,Ua MODERATE (*)    All other components within normal limits  CHLAMYDIA/NGC RT PCR ([facility] ONLY)  POCT PREGNANCY, URINE  POC URINE PREG, ED   ____________________________________________  PROCEDURES  Procedures Azithromycin 1 g p.o. Rocephin 250 mg IM Flagyl 500 mg p.o. ____________________________________________  INITIAL IMPRESSION / ASSESSMENT AND PLAN / ED COURSE  Patient with ED evaluation of vaginal discharge and concern for exposure to STD.  Patient's labs confirm trichomoniasis and BV.  Patient will be treated empirically for gonorrhea and chlamydia.  She is notified that her partner should be treated for chlamydia and should be treated for trichomoniasis before any intimate contact commences.  Patient will follow-up with primary provider return to the ED as necessary.  She may call back in 2 hours to confirm her GC culture results. ____________________________________________  FINAL CLINICAL IMPRESSION(S) / ED DIAGNOSES  Final diagnoses:  Exposure to STD  BV (bacterial vaginosis)  Trichimoniasis      Ridley Schewe, Charlesetta Ivory, PA-C 07/04/18 1112    Dionne Bucy, MD 07/04/18 1441
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
## 2018-07-04 NOTE — Discharge Instructions (Signed)
You have been treated for gonorrhea and chlamydia.  You are to take prescription medications as prescribed for trichomoniasis and BV.  You should avoid sexual intercourse until all symptoms are resolved, all medications have been taken, and all partners have been treated.  Follow-up with your primary provider or return to the ED as needed.

## 2018-08-31 ENCOUNTER — Other Ambulatory Visit: Payer: Self-pay

## 2018-08-31 ENCOUNTER — Emergency Department
Admission: EM | Admit: 2018-08-31 | Discharge: 2018-08-31 | Disposition: A | Discharge status: Home / Self Care | Payer: Self-pay | Attending: Student in an Organized Health Care Education/Training Program | Admitting: Student in an Organized Health Care Education/Training Program

## 2018-08-31 DIAGNOSIS — A599 Trichomoniasis, unspecified: Secondary | ICD-10-CM

## 2018-08-31 DIAGNOSIS — N3001 Acute cystitis with hematuria: Secondary | ICD-10-CM

## 2018-08-31 DIAGNOSIS — Z202 Contact with and (suspected) exposure to infections with a predominantly sexual mode of transmission: Secondary | ICD-10-CM

## 2018-08-31 DIAGNOSIS — N760 Acute vaginitis: Secondary | ICD-10-CM | POA: Insufficient documentation

## 2018-08-31 DIAGNOSIS — F17210 Nicotine dependence, cigarettes, uncomplicated: Secondary | ICD-10-CM | POA: Insufficient documentation

## 2018-08-31 DIAGNOSIS — B9689 Other specified bacterial agents as the cause of diseases classified elsewhere: Secondary | ICD-10-CM

## 2018-08-31 LAB — WET PREP, GENITAL
Sperm: NONE SEEN
Yeast Wet Prep HPF POC: NONE SEEN

## 2018-08-31 LAB — URINALYSIS, ROUTINE W REFLEX MICROSCOPIC
Bilirubin Urine: NEGATIVE
Glucose, UA: NEGATIVE mg/dL
Ketones, ur: NEGATIVE mg/dL
Nitrite: NEGATIVE
Protein, ur: 30 mg/dL — AB
RBC / HPF: >50 RBC/hpf — ABNORMAL HIGH (ref 0–5)
Specific Gravity, Urine: 1.031 — ABNORMAL HIGH (ref 1.005–1.030)
WBC, UA: >50 WBC/hpf — ABNORMAL HIGH (ref 0–5)
pH: 6 (ref 5.0–8.0)

## 2018-08-31 LAB — POCT PREGNANCY, URINE: Preg Test, Ur: NEGATIVE

## 2018-08-31 LAB — CHLAMYDIA/NGC RT PCR (ARMC ONLY)
Chlamydia Tr: NOT DETECTED
N gonorrhoeae: NOT DETECTED

## 2018-08-31 MED ORDER — CEPHALEXIN 500 MG PO CAPS: 500.0000 mg | ORAL_CAPSULE | Freq: Three times a day (TID) | ORAL | 0 refills | Status: AC

## 2018-08-31 MED ORDER — AZITHROMYCIN 500 MG PO TABS
1000.0000 mg | ORAL_TABLET | Freq: Once | ORAL | Status: AC
  Administered 2018-08-31: 1000 mg via ORAL
  Filled 2018-08-31: qty 2

## 2018-08-31 MED ORDER — LIDOCAINE HCL (PF) 1 % IJ SOLN
INTRAMUSCULAR | Status: AC
  Administered 2018-08-31: 22:00:00 2.1 mL
  Filled 2018-08-31: qty 5

## 2018-08-31 MED ORDER — METRONIDAZOLE 500 MG PO TABS: 500.0000 mg | ORAL_TABLET | Freq: Two times a day (BID) | ORAL | 0 refills | Status: AC

## 2018-08-31 MED ORDER — CEFTRIAXONE SODIUM 1 G IJ SOLR
1.0000 g | Freq: Once | INTRAMUSCULAR | Status: AC
  Administered 2018-08-31: 1 g via INTRAMUSCULAR
  Filled 2018-08-31: qty 10

## 2018-08-31 MED ORDER — METRONIDAZOLE 500 MG PO TABS
2000.0000 mg | ORAL_TABLET | Freq: Once | ORAL | Status: AC
  Administered 2018-08-31: 2000 mg via ORAL
  Filled 2018-08-31: qty 4

## 2018-08-31 MED ORDER — LIDOCAINE HCL (PF) 1 % IJ SOLN
2.1000 mL | Freq: Once | INTRAMUSCULAR | Status: AC
  Administered 2018-08-31: 22:00:00 2.1 mL
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
## 2018-11-21 ENCOUNTER — Emergency Department: Payer: No Typology Code available for payment source

## 2018-11-21 ENCOUNTER — Emergency Department
Admission: EM | Admit: 2018-11-21 | Discharge: 2018-11-21 | Disposition: A | Discharge status: Home / Self Care | Payer: No Typology Code available for payment source | Attending: Emergency Medicine | Admitting: Emergency Medicine

## 2018-11-21 ENCOUNTER — Other Ambulatory Visit: Payer: Self-pay

## 2018-11-21 ENCOUNTER — Encounter: Payer: Self-pay | Admitting: Emergency Medicine

## 2018-11-21 DIAGNOSIS — Y929 Unspecified place or not applicable: Secondary | ICD-10-CM | POA: Diagnosis not present

## 2018-11-21 DIAGNOSIS — R1084 Generalized abdominal pain: Secondary | ICD-10-CM | POA: Insufficient documentation

## 2018-11-21 DIAGNOSIS — M549 Dorsalgia, unspecified: Secondary | ICD-10-CM | POA: Insufficient documentation

## 2018-11-21 DIAGNOSIS — S060X0A Concussion without loss of consciousness, initial encounter: Secondary | ICD-10-CM | POA: Insufficient documentation

## 2018-11-21 DIAGNOSIS — S0990XA Unspecified injury of head, initial encounter: Secondary | ICD-10-CM | POA: Diagnosis present

## 2018-11-21 DIAGNOSIS — S161XXA Strain of muscle, fascia and tendon at neck level, initial encounter: Secondary | ICD-10-CM

## 2018-11-21 DIAGNOSIS — Y939 Activity, unspecified: Secondary | ICD-10-CM | POA: Diagnosis not present

## 2018-11-21 DIAGNOSIS — R0789 Other chest pain: Secondary | ICD-10-CM | POA: Insufficient documentation

## 2018-11-21 DIAGNOSIS — Y999 Unspecified external cause status: Secondary | ICD-10-CM | POA: Insufficient documentation

## 2018-11-21 DIAGNOSIS — F17200 Nicotine dependence, unspecified, uncomplicated: Secondary | ICD-10-CM | POA: Insufficient documentation

## 2018-11-21 LAB — URINALYSIS, COMPLETE (UACMP) WITH MICROSCOPIC
Bacteria, UA: NONE SEEN
Bilirubin Urine: NEGATIVE
Glucose, UA: NEGATIVE mg/dL
Ketones, ur: NEGATIVE mg/dL
Leukocytes,Ua: NEGATIVE
Nitrite: NEGATIVE
Protein, ur: NEGATIVE mg/dL
Specific Gravity, Urine: 1.002 — ABNORMAL LOW (ref 1.005–1.030)
pH: 6 (ref 5.0–8.0)

## 2018-11-21 LAB — CBC WITH DIFFERENTIAL/PLATELET
Abs Immature Granulocytes: 0.03 10*3/uL (ref 0.00–0.07)
Basophils Absolute: 0.1 10*3/uL (ref 0.0–0.1)
Basophils Relative: 1 %
Eosinophils Absolute: 0.1 10*3/uL (ref 0.0–0.5)
Eosinophils Relative: 1 %
HCT: 37.1 % (ref 36.0–46.0)
Hemoglobin: 12.2 g/dL (ref 12.0–15.0)
Immature Granulocytes: 0 %
Lymphocytes Relative: 29 %
Lymphs Abs: 3.3 10*3/uL (ref 0.7–4.0)
MCH: 29 pg (ref 26.0–34.0)
MCHC: 32.9 g/dL (ref 30.0–36.0)
MCV: 88.3 fL (ref 80.0–100.0)
Monocytes Absolute: 0.6 10*3/uL (ref 0.1–1.0)
Monocytes Relative: 5 %
Neutro Abs: 7.2 10*3/uL (ref 1.7–7.7)
Neutrophils Relative %: 64 %
Platelets: 298 10*3/uL (ref 150–400)
RBC: 4.2 MIL/uL (ref 3.87–5.11)
RDW: 15.3 % (ref 11.5–15.5)
WBC: 11.3 10*3/uL — ABNORMAL HIGH (ref 4.0–10.5)
nRBC: 0 % (ref 0.0–0.2)

## 2018-11-21 LAB — COMPREHENSIVE METABOLIC PANEL
ALT: 17 U/L (ref 0–44)
AST: 16 U/L (ref 15–41)
Albumin: 3.3 g/dL — ABNORMAL LOW (ref 3.5–5.0)
Alkaline Phosphatase: 73 U/L (ref 38–126)
Anion gap: 8 (ref 5–15)
BUN: 12 mg/dL (ref 6–20)
CO2: 24 mmol/L (ref 22–32)
Calcium: 8.2 mg/dL — ABNORMAL LOW (ref 8.9–10.3)
Chloride: 102 mmol/L (ref 98–111)
Creatinine, Ser: 0.64 mg/dL (ref 0.44–1.00)
GFR calc Af Amer: >60 mL/min (ref >60)
GFR calc non Af Amer: >60 mL/min (ref >60)
Glucose, Bld: 100 mg/dL — ABNORMAL HIGH (ref 70–99)
Potassium: 3.6 mmol/L (ref 3.5–5.1)
Sodium: 134 mmol/L — ABNORMAL LOW (ref 135–145)
Total Bilirubin: 0.4 mg/dL (ref 0.3–1.2)
Total Protein: 6.8 g/dL (ref 6.5–8.1)

## 2018-11-21 LAB — POCT PREGNANCY, URINE: Preg Test, Ur: NEGATIVE

## 2018-11-21 MED ORDER — IOHEXOL 300 MG/ML  SOLN
100.0000 mL | Freq: Once | INTRAMUSCULAR | Status: AC | PRN
  Administered 2018-11-21: 100 mL via INTRAVENOUS
  Filled 2018-11-21: qty 100

## 2018-11-21 MED ORDER — OXYCODONE-ACETAMINOPHEN 5-325 MG PO TABS: 1.0000 | ORAL_TABLET | Freq: Four times a day (QID) | ORAL | 0 refills | Status: AC | PRN

## 2018-11-21 MED ORDER — OXYCODONE-ACETAMINOPHEN 5-325 MG PO TABS
1.0000 | ORAL_TABLET | Freq: Once | ORAL | Status: AC
  Administered 2018-11-21: 17:00:00 1 via ORAL
  Filled 2018-11-21: qty 1
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
## 2018-11-21 NOTE — Discharge Instructions (Signed)
Call and make an appointment with your primary care provider for follow-up.  As was already discussed your CT scans were negative for any acute injury or head bleed.  There is an area that is 1 cm on your liver that will need to be studied further by your primary care provider.  They can arrange outpatient imaging.  You will expect to be sore for the next 4 to 5 days even with medication.  You may use ice to your bruises as needed for discomfort.  You may also use heat or ice packs to your back or neck as needed for discomfort.  Take Percocet 1 every 6 hours if needed for pain.  Do not drive or operate machinery while taking this medication.  Return to the ED over the holiday weekend if any severe worsening of your symptoms.

## 2019-07-07 ENCOUNTER — Emergency Department: Payer: Self-pay

## 2019-07-07 ENCOUNTER — Emergency Department
Admission: EM | Admit: 2019-07-07 | Discharge: 2019-07-07 | Disposition: A | Discharge status: Home / Self Care | Payer: Self-pay | Attending: Emergency Medicine | Admitting: Emergency Medicine

## 2019-07-07 ENCOUNTER — Other Ambulatory Visit: Payer: Self-pay

## 2019-07-07 DIAGNOSIS — R1032 Left lower quadrant pain: Secondary | ICD-10-CM | POA: Insufficient documentation

## 2019-07-07 DIAGNOSIS — R1012 Left upper quadrant pain: Secondary | ICD-10-CM | POA: Insufficient documentation

## 2019-07-07 DIAGNOSIS — R109 Unspecified abdominal pain: Secondary | ICD-10-CM

## 2019-07-07 DIAGNOSIS — F17210 Nicotine dependence, cigarettes, uncomplicated: Secondary | ICD-10-CM | POA: Insufficient documentation

## 2019-07-07 LAB — COMPREHENSIVE METABOLIC PANEL
ALT: 16 U/L (ref 0–44)
AST: 15 U/L (ref 15–41)
Albumin: 3.4 g/dL — ABNORMAL LOW (ref 3.5–5.0)
Alkaline Phosphatase: 69 U/L (ref 38–126)
Anion gap: 9 (ref 5–15)
BUN: 11 mg/dL (ref 6–20)
CO2: 23 mmol/L (ref 22–32)
Calcium: 8.9 mg/dL (ref 8.9–10.3)
Chloride: 107 mmol/L (ref 98–111)
Creatinine, Ser: 0.73 mg/dL (ref 0.44–1.00)
GFR calc Af Amer: >60 mL/min (ref >60)
GFR calc non Af Amer: >60 mL/min (ref >60)
Glucose, Bld: 102 mg/dL — ABNORMAL HIGH (ref 70–99)
Potassium: 3.4 mmol/L — ABNORMAL LOW (ref 3.5–5.1)
Sodium: 139 mmol/L (ref 135–145)
Total Bilirubin: 0.3 mg/dL (ref 0.3–1.2)
Total Protein: 7.3 g/dL (ref 6.5–8.1)

## 2019-07-07 LAB — CBC
HCT: 41.2 % (ref 36.0–46.0)
Hemoglobin: 13.6 g/dL (ref 12.0–15.0)
MCH: 28.9 pg (ref 26.0–34.0)
MCHC: 33 g/dL (ref 30.0–36.0)
MCV: 87.7 fL (ref 80.0–100.0)
Platelets: 321 10*3/uL (ref 150–400)
RBC: 4.7 MIL/uL (ref 3.87–5.11)
RDW: 14.1 % (ref 11.5–15.5)
WBC: 11.5 10*3/uL — ABNORMAL HIGH (ref 4.0–10.5)
nRBC: 0 % (ref 0.0–0.2)

## 2019-07-07 LAB — URINALYSIS, COMPLETE (UACMP) WITH MICROSCOPIC
Bacteria, UA: NONE SEEN
Bilirubin Urine: NEGATIVE
Glucose, UA: NEGATIVE mg/dL
Ketones, ur: NEGATIVE mg/dL
Leukocytes,Ua: NEGATIVE
Nitrite: NEGATIVE
Protein, ur: NEGATIVE mg/dL
Specific Gravity, Urine: 1.033 — ABNORMAL HIGH (ref 1.005–1.030)
pH: 5 (ref 5.0–8.0)

## 2019-07-07 LAB — LIPASE, BLOOD: Lipase: 33 U/L (ref 11–51)

## 2019-07-07 LAB — POCT PREGNANCY, URINE: Preg Test, Ur: NEGATIVE

## 2019-07-07 MED ORDER — ACETAMINOPHEN 500 MG PO TABS
1000.0000 mg | ORAL_TABLET | Freq: Once | ORAL | Status: AC
  Administered 2019-07-07: 1000 mg via ORAL
  Filled 2019-07-07: qty 2

## 2019-07-07 MED ORDER — CYCLOBENZAPRINE HCL 5 MG PO TABS: 5.0000 mg | ORAL_TABLET | Freq: Three times a day (TID) | ORAL | 0 refills | Status: DC | PRN

## 2019-07-07 MED ORDER — IOHEXOL 300 MG/ML  SOLN
100.0000 mL | Freq: Once | INTRAMUSCULAR | Status: AC | PRN
  Administered 2019-07-07: 100 mL via INTRAVENOUS
  Filled 2019-07-07: qty 100

## 2019-07-07 NOTE — ED Provider Notes (Signed)
[facility] EMERGENCY DEPARTMENT Provider Note   CSN: [id number] Arrival date & time: 07/07/19  1658     History Chief Complaint  Patient presents with  . Abdominal Pain    Stephanie Ballard is a 34 y.o. female presents to the emergency department for evaluation of left-sided abdominal pain.  She points to the left upper and lower abdomen, describes a sharp pain that is worse with taking a deep breath.  Pain is located along the left upper abdomen.  Pain is constant but worse with movement and taking a deep breath.  Pain is sharp and 8 out of 10.  Symptoms been present for 2 days with no trauma or injury.  She denies any other symptoms such as fevers, chills, body aches, nausea, vomiting, constipation, diarrhea, vaginal bleeding or vaginal discharge or dysuria.  She does admit to having an increase in urinary frequency.  She does have a little bit of left lower back pain associated with the left upper and lower abdominal pain.  No viral symptoms.  States she has been tolerating p.o. well.  No history of abdominal surgeries.  HPI     History reviewed. No pertinent past medical history.  Patient Active Problem List   Diagnosis Date Noted  . Dental abscess 06/05/2016    Past Surgical History:  Procedure Laterality Date  . CESAREAN SECTION    . cyst removed       OB History   No obstetric history on file.     No family history on file.  Social History   Tobacco Use  . Smoking status: Current Some Day Smoker    Types: Cigarettes  . Smokeless tobacco: Never Used  Substance Use Topics  . Alcohol use: Yes    Comment: once/week  . Drug use: No    Home Medications Prior to Admission medications   Medication Sig Start Date End Date Taking? Authorizing Provider  cyclobenzaprine (FLEXERIL) 5 MG tablet Take 1-2 tablets (5-10 mg total) by mouth 3 (three) times daily as needed for muscle spasms. 07/07/19   Evon Slack, PA-C  oxyCODONE-acetaminophen  (PERCOCET) 5-325 MG tablet Take 1 tablet by mouth every 6 (six) hours as needed for severe pain. 11/21/18 11/21/19  Tommi Rumps, PA-C    Allergies    Patient has no known allergies.  Review of Systems   Review of Systems  Constitutional: Negative for chills and fever.  Respiratory: Negative for cough and shortness of breath.   Cardiovascular: Negative for chest pain and leg swelling.  Gastrointestinal: Positive for abdominal pain. Negative for constipation, diarrhea, nausea and vomiting.  Genitourinary: Positive for frequency. Negative for dysuria, vaginal bleeding and vaginal discharge.  Musculoskeletal: Negative for myalgias.  Skin: Negative for rash and wound.  Neurological: Negative for dizziness and light-headedness.    Physical Exam Updated Vital Signs BP (!) 154/86 (BP Location: Left Arm)   Pulse 80   Temp 97.7 F (36.5 C) (Oral)   Resp 16   Ht 5' (1.524 m)   Wt 129.3 kg   LMP 06/14/2019 Comment: neg preg test 07/07/19  SpO2 97%   BMI 55.66 kg/m   Physical Exam Constitutional:      Appearance: She is well-developed. She is obese.  HENT:     Head: Normocephalic and atraumatic.     Nose: No congestion.  Eyes:     Conjunctiva/sclera: Conjunctivae normal.  Cardiovascular:     Rate and Rhythm: Normal rate.  Pulmonary:  Effort: Pulmonary effort is normal. No respiratory distress.  Abdominal:     Palpations: Abdomen is soft.     Comments: Abdomen soft and nondistended.  She is obese.  There is left upper and left lower quadrant tenderness with mild grimacing.  Bowel sounds are normal.  No pelvic tenderness.  Musculoskeletal:        General: Normal range of motion.     Cervical back: Normal range of motion.  Skin:    General: Skin is warm.     Findings: No rash.  Neurological:     Mental Status: She is alert and oriented to person, place, and time.  Psychiatric:        Behavior: Behavior normal.        Thought Content: Thought content normal.     ED  Results / Procedures / Treatments   Labs (all labs ordered are listed, but only abnormal results are displayed) Labs Reviewed  COMPREHENSIVE METABOLIC PANEL - Abnormal; Notable for the following components:      Result Value   Potassium 3.4 (*)    Glucose, Bld 102 (*)    Albumin 3.4 (*)    All other components within normal limits  CBC - Abnormal; Notable for the following components:   WBC 11.5 (*)    All other components within normal limits  URINALYSIS, COMPLETE (UACMP) WITH MICROSCOPIC - Abnormal; Notable for the following components:   Color, Urine YELLOW (*)    APPearance HAZY (*)    Specific Gravity, Urine 1.033 (*)    Hgb urine dipstick SMALL (*)    All other components within normal limits  LIPASE, BLOOD  POC URINE PREG, ED  POCT PREGNANCY, URINE    EKG None  Radiology CT ABDOMEN PELVIS W CONTRAST  Result Date: 07/07/2019 CLINICAL DATA:  Diverticulitis. Left lower quadrant abdominal pain. EXAM: CT ABDOMEN AND PELVIS WITH CONTRAST TECHNIQUE: Multidetector CT imaging of the abdomen and pelvis was performed using the standard protocol following bolus administration of intravenous contrast. CONTRAST:  116mL OMNIPAQUE IOHEXOL 300 MG/ML  SOLN COMPARISON:  November 21, 2018. FINDINGS: Lower chest: The lung bases are clear. The heart size is normal. Hepatobiliary: There is a stable indeterminate hypoattenuating lesion in the left hepatic lobe (axial series 2, image 11), currently measuring approximately 1 cm. Cholelithiasis without acute inflammation.There is no biliary ductal dilation. Pancreas: Normal contours without ductal dilatation. No peripancreatic fluid collection. Spleen: No splenic laceration or hematoma. Adrenals/Urinary Tract: --Adrenal glands: No adrenal hemorrhage. --Right kidney/ureter: No hydronephrosis or perinephric hematoma. --Left kidney/ureter: No hydronephrosis or perinephric hematoma. --Urinary bladder: Unremarkable. Stomach/Bowel: --Stomach/Duodenum: No hiatal  hernia or other gastric abnormality. Normal duodenal course and caliber. --Small bowel: No dilatation or inflammation. --Colon: No focal abnormality. --Appendix: Normal. Vascular/Lymphatic: Normal course and caliber of the major abdominal vessels. --No retroperitoneal lymphadenopathy. --No mesenteric lymphadenopathy. --No pelvic or inguinal lymphadenopathy. Reproductive: Unremarkable Other: No ascites or free air. There is a small fat containing umbilical hernia. Musculoskeletal. No acute displaced fractures. IMPRESSION: 1. No CT findings to explain the patient's left lower quadrant pain. 2. Cholelithiasis without evidence of acute cholecystitis. 3. There is a stable indeterminate hypoattenuating lesion in the left hepatic lobe. While this is favored to represent a benign process given its stability, it remains indeterminate. Follow-up with a contrast enhanced MRI in the outpatient setting is recommended. Electronically Signed   By: Constance Holster M.D.   On: 07/07/2019 20:22    Procedures Procedures (including critical care time)  Medications Ordered in  ED Medications  acetaminophen (TYLENOL) tablet 1,000 mg (1,000 mg Oral Given 07/07/19 1842)  iohexol (OMNIPAQUE) 300 MG/ML solution 100 mL (100 mLs Intravenous Contrast Given 07/07/19 2004)    ED Course  I have reviewed the triage vital signs and the nursing notes.  Pertinent labs & imaging results that were available during my care of the patient were reviewed by me and considered in my medical decision making (see chart for details).    MDM Rules/Calculators/A&P                      34 year old female with left flank and abdominal pain.  Vital signs are stable, afebrile.  Slight elevation in white count with no signs of UTI.  CT abdomen pelvis unremarkable.  Overall patient appears well and in no distress.  We discussed treatment options to try to control pain, will try Tylenol, ibuprofen and Flexeril.  Patient thinks this could be muscular  along the left flank, she does have tenderness to exam along with increased pain with movement consistent with musculoskeletal pain.  Work-up negative for any acute intra-abdominal process.  She understands signs and symptoms return to the ED for. Final Clinical Impression(s) / ED Diagnoses Final diagnoses:  Left upper quadrant abdominal pain  Left lower quadrant abdominal pain  Left flank pain    Rx / DC Orders ED Discharge Orders         Ordered    cyclobenzaprine (FLEXERIL) 5 MG tablet  3 times daily PRN     07/07/19 2109           Evon Slack, PA-C 07/07/19 2112    Concha Se, MD 07/08/19 [phone]

## 2019-07-07 NOTE — ED Triage Notes (Signed)
Left sided sharp abdominal pain intermittently over last 2 days. Pt alert and oriented X4, cooperative, RR even and unlabored, color WNL. Pt in NAD. denies constipation or urinary sx

## 2019-07-07 NOTE — Discharge Instructions (Addendum)
Please take Tylenol and/or ibuprofen as needed for mild to moderate pain.  You may use the Flexeril as needed for more severe discomfort.  If any worsening pain, fevers, nausea, vomiting or diarrhea please return to the emergency department.  Follow-up with PCP if no improvement of symptoms in the next 24 to 48 hours.

## 2019-09-21 ENCOUNTER — Emergency Department
Admission: EM | Admit: 2019-09-21 | Discharge: 2019-09-21 | Disposition: A | Discharge status: Home / Self Care | Payer: Self-pay | Attending: Emergency Medicine | Admitting: Emergency Medicine

## 2019-09-21 ENCOUNTER — Other Ambulatory Visit: Payer: Self-pay

## 2019-09-21 ENCOUNTER — Encounter: Payer: Self-pay | Admitting: *Deleted

## 2019-09-21 DIAGNOSIS — Z20822 Contact with and (suspected) exposure to covid-19: Secondary | ICD-10-CM | POA: Insufficient documentation

## 2019-09-21 DIAGNOSIS — F17210 Nicotine dependence, cigarettes, uncomplicated: Secondary | ICD-10-CM | POA: Insufficient documentation

## 2019-09-21 DIAGNOSIS — J020 Streptococcal pharyngitis: Secondary | ICD-10-CM | POA: Insufficient documentation

## 2019-09-21 LAB — GROUP A STREP BY PCR: Group A Strep by PCR: DETECTED — AB

## 2019-09-21 MED ORDER — DEXAMETHASONE 10 MG/ML FOR PEDIATRIC ORAL USE
16.0000 mg | Freq: Once | INTRAMUSCULAR | Status: AC
  Administered 2019-09-21: 20:00:00 16 mg via ORAL
  Filled 2019-09-21: qty 2

## 2019-09-21 MED ORDER — AMOXICILLIN 250 MG/5ML PO SUSR
500.0000 mg | Freq: Once | ORAL | Status: AC
  Administered 2019-09-21: 500 mg via ORAL
  Filled 2019-09-21: qty 10

## 2019-09-21 MED ORDER — KETOROLAC TROMETHAMINE 30 MG/ML IJ SOLN
30.0000 mg | Freq: Once | INTRAMUSCULAR | Status: AC
  Administered 2019-09-21: 20:00:00 30 mg via INTRAMUSCULAR
  Filled 2019-09-21: qty 1

## 2019-09-21 MED ORDER — AMOXICILLIN 400 MG/5ML PO SUSR: 500.0000 mg | Freq: Two times a day (BID) | ORAL | 0 refills | Status: AC

## 2019-09-21 NOTE — ED Notes (Signed)
See triage note. Pt with 2+ swollen tonsils with evident exudate. Pt states she feels like her breathing is restricted by her swollen throat. Annice Pih PA notified. Pt A&Ox4, no respiratory distress noted.

## 2019-09-21 NOTE — ED Provider Notes (Signed)
Emergency Department Provider Note  ____________________________________________  Time seen: Approximately 8:07 PM  I have reviewed the triage vital signs and the nursing notes.   HISTORY  Chief Complaint Sore Throat   Historian Patient    HPI Stephanie Ballard is a 34 y.o. female presents to the emergency department with pharyngitis for the past 2 days.  Patient also has headache and fever and chills at home.  She denies emesis or persistent cough.  No shortness of breath or chest tightness.  Patient denies known contacts with COVID-19 or group A strep.  No other alleviating measures have been attempted.   No past medical history on file.   Immunizations up to date:  Yes.     No past medical history on file.  Patient Active Problem List   Diagnosis Date Noted  . Dental abscess 06/05/2016    Past Surgical History:  Procedure Laterality Date  . CESAREAN SECTION    . cyst removed      Prior to Admission medications   Medication Sig Start Date End Date Taking? Authorizing Provider  amoxicillin (AMOXIL) 400 MG/5ML suspension Take 6.3 mLs (500 mg total) by mouth 2 (two) times daily for 10 days. 09/21/19 10/01/19  Lannie Fields, PA-C  cyclobenzaprine (FLEXERIL) 5 MG tablet Take 1-2 tablets (5-10 mg total) by mouth 3 (three) times daily as needed for muscle spasms. 07/07/19   Duanne Guess, PA-C  oxyCODONE-acetaminophen (PERCOCET) 5-325 MG tablet Take 1 tablet by mouth every 6 (six) hours as needed for severe pain. 11/21/18 11/21/19  Johnn Hai, PA-C    Allergies Patient has no known allergies.  No family history on file.  Social History Social History   Tobacco Use  . Smoking status: Current Some Day Smoker    Types: Cigarettes  . Smokeless tobacco: Never Used  Substance Use Topics  . Alcohol use: Yes    Comment: once/week  . Drug use: No     Review of Systems  Constitutional: No fever/chills Eyes:  No discharge ENT: Patient has pharyngitis.   Respiratory: no cough. No SOB/ use of accessory muscles to breath Gastrointestinal:   No nausea, no vomiting.  No diarrhea.  No constipation. Musculoskeletal: Negative for musculoskeletal pain. Skin: Negative for rash, abrasions, lacerations, ecchymosis.    ____________________________________________   PHYSICAL EXAM:  VITAL SIGNS: ED Triage Vitals  Enc Vitals Group     BP 09/21/19 1905 120/74     Pulse Rate 09/21/19 1905 (!) 105     Resp 09/21/19 1905 20     Temp 09/21/19 1905 99.2 F (37.3 C)     Temp Source 09/21/19 1905 Oral     SpO2 09/21/19 1905 100 %     Weight 09/21/19 1907 280 lb (127 kg)     Height 09/21/19 1907 5\' 3"  (1.6 m)     Head Circumference --      Peak Flow --      Pain Score 09/21/19 1906 9     Pain Loc --      Pain Edu? --      Excl. in [place]? --      Constitutional: Alert and oriented. Well appearing and in no acute distress. Eyes: Conjunctivae are normal. PERRL. EOMI. Head: Atraumatic. ENT:      Ears: TMs are pearly.       Nose: No congestion/rhinnorhea.      Mouth/Throat: Mucous membranes are moist.  Posterior pharynx is erythematous with tonsillar hypertrophy and exudate.  Tonsils are  3+ and touching. Neck: No stridor.  No cervical spine tenderness to palpation. Hematological/Lymphatic/Immunilogical: Palpable cervical lymphadenopathy. Cardiovascular: Normal rate, regular rhythm. Normal S1 and S2.  Good peripheral circulation. Respiratory: Normal respiratory effort without tachypnea or retractions. Lungs CTAB. Good air entry to the bases with no decreased or absent breath sounds Skin:  Skin is warm, dry and intact. No rash noted. Psychiatric: Mood and affect are normal for age. Speech and behavior are normal.   ____________________________________________   LABS (all labs ordered are listed, but only abnormal results are displayed)  Labs Reviewed  GROUP A STREP BY PCR - Abnormal; Notable for the following components:      Result Value    Group A Strep by PCR DETECTED (*)    All other components within normal limits  SARS CORONAVIRUS 2 (TAT 6-24 HRS)   ____________________________________________  EKG   ____________________________________________  RADIOLOGY   No results found.  ____________________________________________    PROCEDURES  Procedure(s) performed:     Procedures     Medications  dexamethasone (DECADRON) 10 MG/ML injection for Pediatric ORAL use 16 mg (16 mg Oral Given 09/21/19 2013)  ketorolac (TORADOL) 30 MG/ML injection 30 mg (30 mg Intramuscular Given 09/21/19 2015)  amoxicillin (AMOXIL) 250 MG/5ML suspension 500 mg (500 mg Oral Given 09/21/19 2139)     ____________________________________________   INITIAL IMPRESSION / ASSESSMENT AND PLAN / ED COURSE  Pertinent labs & imaging results that were available during my care of the patient were reviewed by me and considered in my medical decision making (see chart for details).      Assessment and Plan: Pharyngitis:  34 year old female presents to the emergency department with pharyngitis, fever and headache for the past 2 days.  Patient tested positive for group A strep in the emergency department.  She was given amoxicillin, Decadron and Toradol in the emergency department.  She stated that she felt significant improvement in her pharyngitis after Toradol and Decadron were administered.  She was advised to follow-up with primary care as needed.  All patient questions were answered.    ____________________________________________  FINAL CLINICAL IMPRESSION(S) / ED DIAGNOSES  Final diagnoses:  Strep throat      NEW MEDICATIONS STARTED DURING THIS VISIT:  ED Discharge Orders         Ordered    amoxicillin (AMOXIL) 400 MG/5ML suspension  2 times daily     09/21/19 2128              This chart was dictated using voice recognition software/Dragon. Despite best efforts to proofread, errors can occur which can change the  meaning. Any change was purely unintentional.     Orvil Feil, PA-C 09/21/19 2204    Dionne Bucy, MD 09/21/19 [phone]

## 2019-09-21 NOTE — ED Triage Notes (Signed)
Pt has a sore throat since yesterday.  Pt has congestion.  Pt works in assisted living.  Pt alert  Speech clear.

## 2019-09-21 NOTE — Discharge Instructions (Signed)
Take amoxicillin twice daily for the next 10 days. 

## 2019-09-22 LAB — SARS CORONAVIRUS 2 (TAT 6-24 HRS): SARS Coronavirus 2: NEGATIVE

## 2019-12-11 ENCOUNTER — Other Ambulatory Visit: Payer: Self-pay

## 2019-12-11 ENCOUNTER — Ambulatory Visit (LOCAL_COMMUNITY_HEALTH_CENTER): Payer: Self-pay

## 2019-12-11 DIAGNOSIS — Z111 Encounter for screening for respiratory tuberculosis: Secondary | ICD-10-CM

## 2019-12-14 ENCOUNTER — Other Ambulatory Visit: Payer: Self-pay

## 2020-07-16 ENCOUNTER — Other Ambulatory Visit: Payer: Self-pay

## 2020-07-16 ENCOUNTER — Emergency Department: Payer: Self-pay

## 2020-07-16 ENCOUNTER — Encounter: Payer: Self-pay | Admitting: Intensive Care

## 2020-07-16 ENCOUNTER — Emergency Department
Admission: EM | Admit: 2020-07-16 | Discharge: 2020-07-16 | Disposition: A | Discharge status: Home / Self Care | Payer: Self-pay | Attending: Emergency Medicine | Admitting: Emergency Medicine

## 2020-07-16 DIAGNOSIS — R350 Frequency of micturition: Secondary | ICD-10-CM | POA: Insufficient documentation

## 2020-07-16 DIAGNOSIS — R1031 Right lower quadrant pain: Secondary | ICD-10-CM | POA: Insufficient documentation

## 2020-07-16 DIAGNOSIS — F17210 Nicotine dependence, cigarettes, uncomplicated: Secondary | ICD-10-CM | POA: Insufficient documentation

## 2020-07-16 DIAGNOSIS — R109 Unspecified abdominal pain: Secondary | ICD-10-CM

## 2020-07-16 LAB — WET PREP, GENITAL
Clue Cells Wet Prep HPF POC: NONE SEEN
Sperm: NONE SEEN
Trich, Wet Prep: NONE SEEN
WBC, Wet Prep HPF POC: NONE SEEN
Yeast Wet Prep HPF POC: NONE SEEN

## 2020-07-16 LAB — CHLAMYDIA/NGC RT PCR (ARMC ONLY)
Chlamydia Tr: NOT DETECTED
N gonorrhoeae: NOT DETECTED

## 2020-07-16 LAB — CBG MONITORING, ED: Glucose-Capillary: 84 mg/dL (ref 70–99)

## 2020-07-16 LAB — URINALYSIS, COMPLETE (UACMP) WITH MICROSCOPIC
Bilirubin Urine: NEGATIVE
Glucose, UA: NEGATIVE mg/dL
Ketones, ur: NEGATIVE mg/dL
Leukocytes,Ua: NEGATIVE
Nitrite: NEGATIVE
Protein, ur: NEGATIVE mg/dL
Specific Gravity, Urine: 1.027 (ref 1.005–1.030)
pH: 5 (ref 5.0–8.0)

## 2020-07-16 LAB — POC URINE PREG, ED: Preg Test, Ur: NEGATIVE

## 2020-07-16 NOTE — ED Notes (Signed)
Pt given blanket per request.  

## 2020-07-16 NOTE — ED Triage Notes (Signed)
Patient c/o strong urine smell with frequent urination. Also c/o clear/white discharge. Denies pain

## 2020-07-16 NOTE — Discharge Instructions (Addendum)
Please follow-up with primary care regarding your urinary frequency.  You will be called if your urine culture is positive.

## 2020-07-17 NOTE — ED Provider Notes (Signed)
[facility] Emergency Department Provider Note  ____________________________________________   Event Date/Time   First MD Initiated Contact with Patient 07/16/20 1946     (approximate)  I have reviewed the triage vital signs and the nursing notes.   HISTORY  Chief Complaint Urinary Tract Infection  HPI Stephanie Ballard is a 35 y.o. female who presents to the emergency department for evaluation of urinary frequency as well as occasional right flank pain that has been present for the last 2 weeks.  She denies any dysuria, change in vaginal discharge.  She denies any concern for STD risk.  She describes the flank pain as intermittent, seemingly worse at night.  Denies fever, chest pain, shortness of breath, nausea, vomiting or diarrhea.         History reviewed. No pertinent past medical history.  Patient Active Problem List   Diagnosis Date Noted  . Dental abscess 06/05/2016    Past Surgical History:  Procedure Laterality Date  . CESAREAN SECTION    . cyst removed      Prior to Admission medications   Medication Sig Start Date End Date Taking? Authorizing Provider  cyclobenzaprine (FLEXERIL) 5 MG tablet Take 1-2 tablets (5-10 mg total) by mouth 3 (three) times daily as needed for muscle spasms. 07/07/19   Evon Slack, PA-C    Allergies Patient has no known allergies.  History reviewed. No pertinent family history.  Social History Social History   Tobacco Use  . Smoking status: Current Some Day Smoker    Types: Cigarettes  . Smokeless tobacco: Never Used  Substance Use Topics  . Alcohol use: Yes    Alcohol/week: 2.0 standard drinks    Types: 2 Cans of beer per week  . Drug use: No    Review of Systems Constitutional: No fever/chills Eyes: No visual changes. ENT: No sore throat. Cardiovascular: Denies chest pain. Respiratory: Denies shortness of breath. Gastrointestinal: No abdominal pain.  No nausea, no vomiting.  No  diarrhea.  No constipation. Genitourinary: + Flank pain, + urinary frequency, negative for dysuria. Musculoskeletal: Negative for back pain. Skin: Negative for rash. Neurological: Negative for headaches, focal weakness or numbness.  ____________________________________________   PHYSICAL EXAM:  VITAL SIGNS: ED Triage Vitals  Enc Vitals Group     BP 07/16/20 1753 (!) 152/88     Pulse Rate 07/16/20 1753 83     Resp 07/16/20 1753 18     Temp 07/16/20 1753 98.2 F (36.8 C)     Temp Source 07/16/20 1753 Oral     SpO2 07/16/20 1753 100 %     Weight 07/16/20 1754 280 lb (127 kg)     Height 07/16/20 1754 5' (1.524 m)     Head Circumference --      Peak Flow --      Pain Score 07/16/20 1754 0     Pain Loc --      Pain Edu? --      Excl. in [place]? --    Constitutional: Alert and oriented. Well appearing and in no acute distress. Eyes: Conjunctivae are normal. PERRL. EOMI. Head: Atraumatic. Nose: No congestion/rhinnorhea. Mouth/Throat: Mucous membranes are moist.  Oropharynx non-erythematous. Neck: No stridor.   Cardiovascular: Normal rate, regular rhythm. Grossly normal heart sounds.  Good peripheral circulation. Respiratory: Normal respiratory effort.  No retractions. Lungs CTAB. Gastrointestinal: Soft and nontender. No distention. No abdominal bruits. No CVA tenderness. Musculoskeletal: No lower extremity tenderness nor edema.  No joint effusions. Neurologic:  Normal  speech and language. No gross focal neurologic deficits are appreciated. No gait instability. Skin:  Skin is warm, dry and intact. No rash noted. Psychiatric: Mood and affect are normal. Speech and behavior are normal.  ____________________________________________   LABS (all labs ordered are listed, but only abnormal results are displayed)  Labs Reviewed  URINALYSIS, COMPLETE (UACMP) WITH MICROSCOPIC - Abnormal; Notable for the following components:      Result Value   Color, Urine YELLOW (*)    APPearance HAZY  (*)    Hgb urine dipstick SMALL (*)    Bacteria, UA RARE (*)    All other components within normal limits  WET PREP, GENITAL  CHLAMYDIA/NGC RT PCR ([facility] ONLY)  URINE CULTURE  POC URINE PREG, ED  CBG MONITORING, ED   ____________________________________________  RADIOLOGY  Official radiology report(s): CT Renal Stone Study  Result Date: 07/16/2020 CLINICAL DATA:  Flank pain, kidney stone suspected.  Nausea. EXAM: CT ABDOMEN AND PELVIS WITHOUT CONTRAST TECHNIQUE: Multidetector CT imaging of the abdomen and pelvis was performed following the standard protocol without IV contrast. COMPARISON:  CT abdomen pelvis 07/07/2019. FINDINGS: Lower chest: No acute abnormality. Hepatobiliary: Poorly visualized subcentimeter hypodensity within the left hepatic lobe (2:13). No new focal liver abnormality. Calcified gallstones within the gallbladder lumen. No gallbladder wall thickening or or pericholecystic fluid. No biliary dilatation. Pancreas: No focal lesion. Normal pancreatic contour. No surrounding inflammatory changes. No main pancreatic ductal dilatation. This Spleen: Normal in size without focal abnormality. Adrenals/Urinary Tract: No adrenal nodule bilaterally. No nephrolithiasis, no hydronephrosis, and no contour-deforming renal mass. No ureterolithiasis or hydroureter. The urinary bladder is unremarkable. Stomach/Bowel: Stomach is within normal limits. No evidence of bowel wall thickening or dilatation. Appendix appears normal. Vascular/Lymphatic: No abdominal aorta or iliac aneurysm. No abdominal, pelvic, or inguinal lymphadenopathy. Reproductive: Uterus and bilateral adnexa are unremarkable. Other: No intraperitoneal free fluid. No intraperitoneal free gas. No organized fluid collection. Musculoskeletal: No abdominal wall hernia or abnormality No suspicious lytic or blastic osseous lesions. No acute displaced fracture. Multilevel degenerative changes of the spine. IMPRESSION: 1.  Cholelithiasis with no CT findings to suggest acute cholecystitis or choledocholithiasis; however, limited evaluation on CT noncontrast. Consider right upper quadrant ultrasound if clinically indicated. 2. Less conspicuous on noncontrast study, poorly visualized, subcentimeter hypodensity within the left hepatic lobe. Please see CT abdomen pelvis 07/07/2019 for further details. 3. No nephroureterolithiasis bilaterally. 4. Normal appendix. Electronically Signed   By: Tish Frederickson M.D.   On: 07/16/2020 20:59    ____________________________________________   INITIAL IMPRESSION / ASSESSMENT AND PLAN / ED COURSE  As part of my medical decision making, I reviewed the following data within the electronic MEDICAL RECORD NUMBER Nursing notes reviewed and incorporated, Labs reviewed and Notes from prior ED visits        Patient is a 35 year old female who presents to the emergency department for evaluation of urinary frequency over the last 2 weeks associated with intermittent right flank pain.  See HPI for further details.  In triage, the patient has normal vital signs.  The patient has a grossly normal physical exam, with no CVA tenderness or tenderness to palpation of the abdomen.  Laboratory evaluation started with urinalysis which demonstrates a small amount of hemoglobin and rare bacteria but no leukocytes or nitrates present.  Pregnancy is negative.  Given the symptoms, discussed wet prep and GC/chlamydia and patient is amenable with this plan.  These are all negative.  CT renal stone study was obtained given the small amount hemoglobin  as well as the intermittent flank pain.  This was negative for acute renal stone, though did suggest cholelithiasis without any evidence on noncontrast CT of cholecystitis or choledocholithiasis.  Suspect that this could be a cause of the patient's intermittent right flank pain as she describes it after a big dinner and lying down at night.  However, she does not exhibit any  symptoms at this time of infection including no fever, chills or significant abdominal pain.  Discussed possibility of proceeding with right upper quadrant ultrasound, however the patient declines this option and prefers to follow-up with PCP.  I feel this is reasonable given low suspicion for acute infection.  The patient's urine will be sent for culture to ensure no significant bacterial growth in the urine.  Patient amenable with this plan and will follow up with PCP.  She stable this time for outpatient follow-up.      ____________________________________________   FINAL CLINICAL IMPRESSION(S) / ED DIAGNOSES  Final diagnoses:  Urinary frequency  Right flank pain     ED Discharge Orders    None      *Please note:  PATSEY PITSTICK was evaluated in Emergency Department on 07/17/2020 for the symptoms described in the history of present illness. She was evaluated in the context of the global COVID-19 pandemic, which necessitated consideration that the patient might be at risk for infection with the SARS-CoV-2 virus that causes COVID-19. Institutional protocols and algorithms that pertain to the evaluation of patients at risk for COVID-19 are in a state of rapid change based on information released by regulatory bodies including the CDC and federal and state organizations. These policies and algorithms were followed during the patient's care in the ED.  Some ED evaluations and interventions may be delayed as a result of limited staffing during and the pandemic.*   Note:  This document was prepared using Dragon voice recognition software and may include unintentional dictation errors.   Lucy Chris, PA 07/17/20 1950    Phineas Semen, MD 07/17/20 [phone]

## 2020-07-18 LAB — URINE CULTURE

## 2020-11-29 ENCOUNTER — Encounter: Payer: Self-pay | Admitting: Family Medicine

## 2020-11-29 ENCOUNTER — Ambulatory Visit: Payer: Self-pay | Admitting: Family Medicine

## 2020-11-29 ENCOUNTER — Other Ambulatory Visit: Payer: Self-pay

## 2020-11-29 DIAGNOSIS — B9689 Other specified bacterial agents as the cause of diseases classified elsewhere: Secondary | ICD-10-CM

## 2020-11-29 DIAGNOSIS — Z113 Encounter for screening for infections with a predominantly sexual mode of transmission: Secondary | ICD-10-CM

## 2020-11-29 DIAGNOSIS — B379 Candidiasis, unspecified: Secondary | ICD-10-CM

## 2020-11-29 DIAGNOSIS — N760 Acute vaginitis: Secondary | ICD-10-CM

## 2020-11-29 LAB — WET PREP FOR TRICH, YEAST, CLUE: Trichomonas Exam: NEGATIVE

## 2020-11-29 MED ORDER — METRONIDAZOLE 500 MG PO TABS: 500.0000 mg | ORAL_TABLET | Freq: Two times a day (BID) | ORAL | 0 refills | Status: AC

## 2020-11-29 MED ORDER — CLOTRIMAZOLE 1 % VA CREA: 1.0000 | TOPICAL_CREAM | Freq: Every day | VAGINAL | 0 refills | Status: AC

## 2020-11-29 MED ORDER — METRONIDAZOLE 500 MG PO TABS: 500.0000 mg | ORAL_TABLET | Freq: Two times a day (BID) | ORAL | 0 refills | Status: DC

## 2020-11-29 NOTE — Progress Notes (Signed)
Pt here for STD screening.  Wet mount results reviewed.  Medication dispensed per Provider orders. Pt declined condoms. Mickael Mcnutt M Panayiotis [place], RN  

## 2020-11-29 NOTE — Progress Notes (Signed)
[facility] Department STI clinic/screening visit  Subjective:  Stephanie Ballard is a 35 y.o. female being seen today for an STI screening visit. The patient reports they do have symptoms.  Patient reports that they do not desire a pregnancy in the next year.   They reported they are not interested in discussing contraception today.  Patient's last menstrual period was 11/22/2020 (approximate).   Patient has the following medical conditions:   Patient Active Problem List   Diagnosis Date Noted   Dental abscess 06/05/2016    Chief Complaint  Patient presents with   SEXUALLY TRANSMITTED DISEASE    screening    HPI  Patient reports here for screenig has had s/sx for 1 week  Last HIV test per patient/review of record was 2 yrs ago  Patient reports last pap was ~ 1 year ago.   See flowsheet for further details and programmatic requirements.    The following portions of the patient's history were reviewed and updated as appropriate: allergies, current medications, past medical history, past social history, past surgical history and problem list.  Objective:  There were no vitals filed for this visit.  Physical Exam Vitals and nursing note reviewed.  Constitutional:      Appearance: Normal appearance. She is obese.  HENT:     Head: Normocephalic and atraumatic.     Mouth/Throat:     Mouth: Mucous membranes are moist.     Pharynx: Oropharynx is clear. No oropharyngeal exudate or posterior oropharyngeal erythema.  Pulmonary:     Effort: Pulmonary effort is normal.  Chest:  Breasts:    Right: No axillary adenopathy or supraclavicular adenopathy.     Left: No axillary adenopathy or supraclavicular adenopathy.  Abdominal:     General: Abdomen is flat.     Palpations: There is no mass.     Tenderness: There is no abdominal tenderness. There is no rebound.  Genitourinary:    General: Normal vulva.     Exam position: Lithotomy position.     Pubic Area: No rash or  pubic lice.      Labia:        Right: No rash or lesion.        Left: No rash or lesion.      Vagina: Normal. No vaginal discharge, erythema, bleeding or lesions.     Cervix: No cervical motion tenderness, discharge, friability, lesion or erythema.     Uterus: Normal.      Adnexa: Right adnexa normal and left adnexa normal.     Rectum: Normal.     Comments: External genitalia without, lice, nits, erythema, edema , lesions or inguinal adenopathy. Some evidence of patient scratching.  Vagina with normal mucosa and white discharge and pH >4.  Cervix without visual lesions, uterus firm, mobile, non-tender, no masses, CMT adnexal fullness or tenderness.   Musculoskeletal:     Cervical back: Normal range of motion and neck supple.  Lymphadenopathy:     Head:     Right side of head: No preauricular or posterior auricular adenopathy.     Left side of head: No preauricular or posterior auricular adenopathy.     Cervical: No cervical adenopathy.     Upper Body:     Right upper body: No supraclavicular or axillary adenopathy.     Left upper body: No supraclavicular or axillary adenopathy.     Lower Body: No right inguinal adenopathy. No left inguinal adenopathy.  Skin:    General: Skin is warm  and dry.     Findings: No rash.  Neurological:     Mental Status: She is alert and oriented to person, place, and time.  Psychiatric:        Behavior: Behavior normal.     Assessment and Plan:  Stephanie Ballard is a 35 y.o. female presenting to the [facility] Department for STI screening  1. Screening examination for venereal disease  - Chlamydia/Gonorrhea [place] [facility] - WET PREP FOR TRICH, YEAST, CLUE   Patient accepted all screenings including wet prep and vaginal CT/GC and declines bloodwork for HIV/RPR.  Patient meets criteria for HepB screening? No. Ordered? No - does not meet criteria  Patient meets criteria for HepC screening? No. Ordered? No - does not meet critieria   Wet  prep results + yeast    Treatment needed for BV and for yeast  Discussed time line for [facility] Lab results and that patient will be called with positive results and encouraged patient to call if she had not heard in 2 weeks.  Counseled to return or seek care for continued or worsening symptoms Recommended condom use with all sex  Patient is currently using  No BCM   to prevent pregnancy.    2. BV (bacterial vaginosis)  - metroNIDAZOLE (FLAGYL) 500 MG tablet; Take 1 tablet (500 mg total) by mouth 2 (two) times daily for 7 days.  Dispense: 14 tablet; Refill: 0  3. Prophylactic measure Pt to use on external vaginal area  - clotrimazole (V-R CLOTRIMAZOLE VAGINAL) 1 % vaginal cream; Place 1 Applicatorful vaginally at bedtime for 7 days.  Dispense: 45 g; Refill: 0     Return for as needed.  No future appointments.  Wendi Snipes, FNP

## 2020-12-15 ENCOUNTER — Other Ambulatory Visit: Payer: Self-pay

## 2020-12-15 ENCOUNTER — Emergency Department: Payer: Self-pay

## 2020-12-15 ENCOUNTER — Emergency Department
Admission: EM | Admit: 2020-12-15 | Discharge: 2020-12-15 | Disposition: A | Discharge status: Home / Self Care | Payer: Self-pay | Attending: Emergency Medicine | Admitting: Emergency Medicine

## 2020-12-15 DIAGNOSIS — F17210 Nicotine dependence, cigarettes, uncomplicated: Secondary | ICD-10-CM | POA: Insufficient documentation

## 2020-12-15 DIAGNOSIS — R300 Dysuria: Secondary | ICD-10-CM | POA: Insufficient documentation

## 2020-12-15 DIAGNOSIS — R1031 Right lower quadrant pain: Secondary | ICD-10-CM | POA: Insufficient documentation

## 2020-12-15 LAB — URINALYSIS, COMPLETE (UACMP) WITH MICROSCOPIC
Bilirubin Urine: NEGATIVE
Glucose, UA: NEGATIVE mg/dL
Ketones, ur: NEGATIVE mg/dL
Leukocytes,Ua: NEGATIVE
Nitrite: NEGATIVE
Protein, ur: NEGATIVE mg/dL
Specific Gravity, Urine: 1.029 (ref 1.005–1.030)
pH: 5 (ref 5.0–8.0)

## 2020-12-15 LAB — POC URINE PREG, ED: Preg Test, Ur: NEGATIVE

## 2020-12-15 NOTE — ED Triage Notes (Signed)
Pt c/o increased urinary frequency with painful urination for the past 2-3 weeks, "I think I have a UTI"

## 2020-12-15 NOTE — ED Provider Notes (Signed)
[facility] Emergency Department Provider Note  ____________________________________________   Event Date/Time   First MD Initiated Contact with Patient 12/15/20 0920     (approximate)  I have reviewed the triage vital signs and the nursing notes.   HISTORY  Chief Complaint Urinary Frequency    HPI Stephanie Ballard is a 35 y.o. female presents emergency department complaining of urinary frequency and hesitation.  No burning.  Patient states she saw her regular doctor for vaginal discharge and her test for STD were negative.  States she continues to have a lot of urinary frequency and also has some right lower quadrant pain.  States she feels very full.  Has a lot of pressure in the lower abdomen.  Patient has not had a bowel movement this week.  No history of ovarian cyst.  No vomiting or diarrhea.  No fever or chills.  Symptoms for 2 to 3 weeks  History reviewed. No pertinent past medical history.  Patient Active Problem List   Diagnosis Date Noted   Dental abscess 06/05/2016    Past Surgical History:  Procedure Laterality Date   CESAREAN SECTION     cyst removed      Prior to Admission medications   Not on File    Allergies Patient has no known allergies.  No family history on file.  Social History Social History   Tobacco Use   Smoking status: Some Days    Years: 10.00    Types: Cigarettes   Smokeless tobacco: Never  Vaping Use   Vaping Use: Never used  Substance Use Topics   Alcohol use: Yes    Alcohol/week: 9.0 standard drinks    Types: 4 Glasses of wine, 5 Cans of beer per week   Drug use: No    Review of Systems  Constitutional: No fever/chills Eyes: No visual changes. ENT: No sore throat. Respiratory: Denies cough Cardiovascular: Denies chest pain Gastrointestinal: Positive abdominal pain Genitourinary: Positive for dysuria. Musculoskeletal: Negative for back pain. Skin: Negative for rash. Psychiatric: no mood  changes,     ____________________________________________   PHYSICAL EXAM:  VITAL SIGNS: ED Triage Vitals  Enc Vitals Group     BP 12/15/20 0816 (!) 137/91     Pulse Rate 12/15/20 0816 80     Resp --      Temp 12/15/20 0816 98.4 F (36.9 C)     Temp Source 12/15/20 0816 Oral     SpO2 12/15/20 0816 96 %     Weight --      Height --      Head Circumference --      Peak Flow --      Pain Score 12/15/20 0755 9     Pain Loc --      Pain Edu? --      Excl. in GC? --     Constitutional: Alert and oriented. Well appearing and in no acute distress. Eyes: Conjunctivae are normal.  Head: Atraumatic. Nose: No congestion/rhinnorhea. Mouth/Throat: Mucous membranes are moist.   Neck:  supple no lymphadenopathy noted Cardiovascular: Normal rate, regular rhythm. Heart sounds are normal Respiratory: Normal respiratory effort.  No retractions, lungs c t a  Abd: soft tender in the right lower quadrant, bs normal all 4 quad GU: deferred Musculoskeletal: FROM all extremities, warm and well perfused Neurologic:  Normal speech and language.  Skin:  Skin is warm, dry and intact. No rash noted. Psychiatric: Mood and affect are normal. Speech and behavior are normal.  ____________________________________________   LABS (all labs ordered are listed, but only abnormal results are displayed)  Labs Reviewed  URINALYSIS, COMPLETE (UACMP) WITH MICROSCOPIC - Abnormal; Notable for the following components:      Result Value   Color, Urine YELLOW (*)    APPearance HAZY (*)    Hgb urine dipstick SMALL (*)    Bacteria, UA RARE (*)    All other components within normal limits  POC URINE PREG, ED   ____________________________________________   ____________________________________________  RADIOLOGY  CT renal stone  ____________________________________________   PROCEDURES  Procedure(s) performed: No  Procedures    ____________________________________________   INITIAL  IMPRESSION / ASSESSMENT AND PLAN / ED COURSE  Pertinent labs & imaging results that were available during my care of the patient were reviewed by me and considered in my medical decision making (see chart for details).   The patient is a 35 year old female presents emergency department with urinary frequency and right lower quadrant pain.  See HPI.  Physical exam shows patient per stable  DDx: UTI, kidney stone, ovarian cyst, acute appendicitis, bowel obstruction  UA shows small amount of hemoglobin and rare bacteria, no leuks or WBCs, POC pregnancy is negative  CT renal stone reviewed by me confirmed by radiology to be negative for any acute abnormality.  I did explain findings to the patient.  States ready for follow-up.  She should keep a diary of what she is drinking during episodes of urinary frequency.  Return emergency department worsening.  States she understands.  She is discharged stable condition.    Stephanie Ballard was evaluated in Emergency Department on 12/15/2020 for the symptoms described in the history of present illness. She was evaluated in the context of the global COVID-19 pandemic, which necessitated consideration that the patient might be at risk for infection with the SARS-CoV-2 virus that causes COVID-19. Institutional protocols and algorithms that pertain to the evaluation of patients at risk for COVID-19 are in a state of rapid change based on information released by regulatory bodies including the CDC and federal and state organizations. These policies and algorithms were followed during the patient's care in the ED.    As part of my medical decision making, I reviewed the following data within the electronic MEDICAL RECORD NUMBER Nursing notes reviewed and incorporated, Labs reviewed , Old chart reviewed, Radiograph reviewed , Notes from prior ED visits, and [place] Controlled Substance Database  ____________________________________________   FINAL CLINICAL IMPRESSION(S) / ED  DIAGNOSES  Final diagnoses:  Dysuria      NEW MEDICATIONS STARTED DURING THIS VISIT:  Discharge Medication List as of 12/15/2020 11:40 AM       Note:  This document was prepared using Dragon voice recognition software and may include unintentional dictation errors.    Faythe Ghee, PA-C 12/15/20 1401    Dionne Bucy, MD 12/15/20 1651

## 2020-12-15 NOTE — Discharge Instructions (Addendum)
Follow up with your regular doctor if not improving in 3 days, return to the ER if worsening Avoid caffeine as this will cause you to have an overactive bladder Try miralax for constipation if needed

## 2021-11-29 ENCOUNTER — Emergency Department
Admission: EM | Admit: 2021-11-29 | Discharge: 2021-11-29 | Disposition: A | Discharge status: Home / Self Care | Payer: Self-pay | Attending: Emergency Medicine | Admitting: Emergency Medicine

## 2021-11-29 ENCOUNTER — Other Ambulatory Visit: Payer: Self-pay

## 2021-11-29 DIAGNOSIS — U071 COVID-19: Secondary | ICD-10-CM | POA: Insufficient documentation

## 2021-11-29 LAB — SARS CORONAVIRUS 2 BY RT PCR: SARS Coronavirus 2 by RT PCR: POSITIVE — AB

## 2021-11-29 MED ORDER — PSEUDOEPH-BROMPHEN-DM 30-2-10 MG/5ML PO SYRP: 5.0000 mL | ORAL_SOLUTION | Freq: Four times a day (QID) | ORAL | 0 refills | Status: DC | PRN

## 2021-11-29 NOTE — ED Provider Notes (Signed)
[facility] Provider Note    Event Date/Time   First MD Initiated Contact with Patient 11/29/21 1303     (approximate)   History   URI   HPI  Stephanie Ballard is a 36 y.o. female presents to the ED with history of 2 days feeling badly.  Patient states that she developed cough, congestion and headache and took a home COVID test which was positive.  She states that her work is requiring that she have a paper with proof that she is positive.  Patient did get vaccinated.  She denies any shortness of breath, difficulty breathing, vomiting or diarrhea.  She states that only 1 day did she have a low fever.     Physical Exam   Triage Vital Signs: ED Triage Vitals  Enc Vitals Group     BP 11/29/21 1211 129/86     Pulse Rate 11/29/21 1211 100     Resp 11/29/21 1211 18     Temp 11/29/21 1211 98.6 F (37 C)     Temp Source 11/29/21 1211 Oral     SpO2 11/29/21 1211 98 %     Weight 11/29/21 1212 290 lb (131.5 kg)     Height 11/29/21 1212 5' (1.524 m)     Head Circumference --      Peak Flow --      Pain Score --      Pain Loc --      Pain Edu? --      Excl. in GC? --     Most recent vital signs: Vitals:   11/29/21 1211  BP: 129/86  Pulse: 100  Resp: 18  Temp: 98.6 F (37 C)  SpO2: 98%     General: Awake, no distress.  CV:  Good peripheral perfusion.  Heart regular rate and rhythm. Resp:  Normal effort.  Lungs are clear bilaterally. Abd:  No distention.  Other:     ED Results / Procedures / Treatments   Labs (all labs ordered are listed, but only abnormal results are displayed) Labs Reviewed  SARS CORONAVIRUS 2 BY RT PCR - Abnormal; Notable for the following components:      Result Value   SARS Coronavirus 2 by RT PCR POSITIVE (*)    All other components within normal limits      PROCEDURES:  Critical Care performed:   Procedures   MEDICATIONS ORDERED IN ED: Medications - No data to display   IMPRESSION / MDM / ASSESSMENT  AND PLAN / ED COURSE  I reviewed the triage vital signs and the nursing notes.   Differential diagnosis includes, but is not limited to, viral illness, upper respiratory infection, COVID with positive home test.  36 year old female presents to the ED with upper respiratory symptoms and fever for the last 2 days.  Patient did get COVID immunization.  She took a home test after 2 days of feeling bad which was positive.  Her work is requiring her to have an official test done.  We discussed staying hydrated, discontinuation of smoking and to return to the emergency department immediately should she develop any shortness of breath or difficulty breathing.  Patient is currently afebrile with an O2 sat of 98%.    Clinical Course as of 11/29/21 1322  Wed Nov 29, 2021  1317 SARS Coronavirus 2 by RT PCR (hospital order, performed in [facility] hospital lab) *cepheid single result test* Anterior Nasal Swab(!) [RS]    Clinical Course User Index [RS]  Tommi Rumps, PA-C   Patient's presentation is most consistent with acute complicated illness / injury requiring diagnostic workup.  FINAL CLINICAL IMPRESSION(S) / ED DIAGNOSES   Final diagnoses:  COVID     Rx / DC Orders   ED Discharge Orders          Ordered    brompheniramine-pseudoephedrine-DM 30-2-10 MG/5ML syrup  4 times daily PRN        11/29/21 1321             Note:  This document was prepared using Dragon voice recognition software and may include unintentional dictation errors.   Tommi Rumps, PA-C 11/29/21 1322    Georga Hacking, MD 11/29/21 [id number]

## 2021-11-29 NOTE — ED Triage Notes (Signed)
Pt c/o cough , congestion, HA for the past 2 days and tested positive for covid at work today, states they wanted her retested here.

## 2021-11-29 NOTE — Discharge Instructions (Addendum)
Follow-up with your primary care provider by calling if any continued problems.  If any severe worsening of your symptoms such as difficulty breathing or shortness of breath you are to return to the emergency department immediately.  Also read information about 10 things that you can do to manage her symptoms at home.  A prescription for Bromfed-DM was sent to your pharmacy for cough and congestion.  You may take Tylenol or ibuprofen as needed for fever, body aches or headache.  A work note is attached to your discharge papers.

## 2022-05-09 ENCOUNTER — Other Ambulatory Visit: Payer: Self-pay

## 2022-05-09 ENCOUNTER — Emergency Department
Admission: EM | Admit: 2022-05-09 | Discharge: 2022-05-09 | Disposition: A | Discharge status: Home / Self Care | Payer: Self-pay | Attending: Emergency Medicine | Admitting: Emergency Medicine

## 2022-05-09 DIAGNOSIS — Z1152 Encounter for screening for COVID-19: Secondary | ICD-10-CM | POA: Insufficient documentation

## 2022-05-09 DIAGNOSIS — J020 Streptococcal pharyngitis: Secondary | ICD-10-CM

## 2022-05-09 DIAGNOSIS — J029 Acute pharyngitis, unspecified: Secondary | ICD-10-CM | POA: Insufficient documentation

## 2022-05-09 LAB — RESP PANEL BY RT-PCR (RSV, FLU A&B, COVID)  RVPGX2
Influenza A by PCR: NEGATIVE
Influenza B by PCR: NEGATIVE
Resp Syncytial Virus by PCR: NEGATIVE
SARS Coronavirus 2 by RT PCR: NEGATIVE

## 2022-05-09 LAB — GROUP A STREP BY PCR: Group A Strep by PCR: DETECTED — AB

## 2022-05-09 MED ORDER — AMOXICILLIN 500 MG PO CAPS: 500.0000 mg | ORAL_CAPSULE | Freq: Two times a day (BID) | ORAL | 0 refills | Status: AC

## 2022-05-09 MED ORDER — KETOROLAC TROMETHAMINE 15 MG/ML IJ SOLN
15.0000 mg | Freq: Once | INTRAMUSCULAR | Status: AC
  Administered 2022-05-09: 15 mg via INTRAMUSCULAR
  Filled 2022-05-09: qty 1

## 2022-05-09 NOTE — ED Triage Notes (Signed)
Pt comes with c/o sore throat, fever and body aches.

## 2022-05-09 NOTE — ED Provider Notes (Signed)
[facility] Provider Note    Event Date/Time   First MD Initiated Contact with Patient 05/09/22 1154     (approximate)   History   Sore Throat   HPI  Stephanie Ballard is a 36 y.o. female sore throat and fever for the past couple of days.  Endorses significant sore throat.  Denies any cough.  No nausea or vomiting.  No abdominal pain.  No dysuria.  Taking Tylenol and Motrin.  No recent antibiotic use.     Physical Exam   Triage Vital Signs: ED Triage Vitals  Enc Vitals Group     BP 05/09/22 1022 (!) 129/101     Pulse Rate 05/09/22 1022 (!) 110     Resp 05/09/22 1022 20     Temp 05/09/22 1022 98.4 F (36.9 C)     Temp src --      SpO2 05/09/22 1022 98 %     Weight --      Height --      Head Circumference --      Peak Flow --      Pain Score 05/09/22 1021 6     Pain Loc --      Pain Edu? --      Excl. in GC? --     Most recent vital signs: Vitals:   05/09/22 1022  BP: (!) 129/101  Pulse: (!) 110  Resp: 20  Temp: 98.4 F (36.9 C)  SpO2: 98%    Physical Exam Constitutional:      Appearance: She is well-developed.  HENT:     Mouth/Throat:     Pharynx: Uvula midline. Oropharyngeal exudate and posterior oropharyngeal erythema present.     Tonsils: Tonsillar exudate present. No tonsillar abscesses. 3+ on the right. 3+ on the left.  Eyes:     Conjunctiva/sclera: Conjunctivae normal.  Cardiovascular:     Rate and Rhythm: Regular rhythm.  Pulmonary:     Effort: No respiratory distress.  Abdominal:     General: There is no distension.  Musculoskeletal:        General: Normal range of motion.     Cervical back: Normal range of motion.  Skin:    General: Skin is warm.  Neurological:     Mental Status: She is alert. Mental status is at baseline.     IMPRESSION / MDM / ASSESSMENT AND PLAN / ED COURSE  I reviewed the triage vital signs and the nursing notes.  Differential diagnosis including viral illness, viral pharyngitis,  strep throat, COVID/influenza, pneumonia.  Clinical picture is not consistent with a PTA or RPA.  Given IM ketorolac for pain control.  LABS (all labs ordered are listed, but only abnormal results are displayed) Labs interpreted as -   COVID and influenza testing are negative.  Strep test was positive. Labs Reviewed  GROUP A STREP BY PCR - Abnormal; Notable for the following components:      Result Value   Group A Strep by PCR DETECTED (*)    All other components within normal limits  RESP PANEL BY RT-PCR (RSV, FLU A&B, COVID)  [id number]  CULTURE, GROUP A STREP [facility])    TREATMENT   IM ketorolac  Will start the patient on amoxicillin.  Given return precautions for worsening symptoms.   PROCEDURES:  Critical Care performed: No  Procedures  Patient's presentation is most consistent with acute illness / injury with system symptoms.   MEDICATIONS ORDERED IN ED: Medications  ketorolac (TORADOL)  15 MG/ML injection 15 mg (15 mg Intramuscular Given 05/09/22 1221)    FINAL CLINICAL IMPRESSION(S) / ED DIAGNOSES   Final diagnoses:  Strep throat     Rx / DC Orders   ED Discharge Orders          Ordered    amoxicillin (AMOXIL) 500 MG capsule  2 times daily        05/09/22 1316             Note:  This document was prepared using Dragon voice recognition software and may include unintentional dictation errors.   Corena Herter, MD 05/09/22 1327

## 2022-05-09 NOTE — Discharge Instructions (Addendum)
Pain control:  Ibuprofen (motrin/aleve/advil) - You can take 3-4 tablets (600-800 mg) every 6 hours as needed for pain/fever.  Acetaminophen (tylenol) - You can take 2 extra strength tablets (1000 mg) every 6 hours as needed for pain/fever.  You can alternate these medications or take them together.  Make sure you eat food/drink water when taking these medications. 

## 2022-05-16 ENCOUNTER — Other Ambulatory Visit: Payer: Self-pay

## 2022-05-16 DIAGNOSIS — R21 Rash and other nonspecific skin eruption: Secondary | ICD-10-CM | POA: Insufficient documentation

## 2022-05-16 LAB — BASIC METABOLIC PANEL
Anion gap: 6 (ref 5–15)
BUN: 17 mg/dL (ref 6–20)
CO2: 25 mmol/L (ref 22–32)
Calcium: 8.9 mg/dL (ref 8.9–10.3)
Chloride: 110 mmol/L (ref 98–111)
Creatinine, Ser: 0.77 mg/dL (ref 0.44–1.00)
GFR, Estimated: >60 mL/min (ref >60)
Glucose, Bld: 96 mg/dL (ref 70–99)
Potassium: 3.7 mmol/L (ref 3.5–5.1)
Sodium: 141 mmol/L (ref 135–145)

## 2022-05-16 LAB — CBC
HCT: 39.4 % (ref 36.0–46.0)
Hemoglobin: 12.7 g/dL (ref 12.0–15.0)
MCH: 28 pg (ref 26.0–34.0)
MCHC: 32.2 g/dL (ref 30.0–36.0)
MCV: 86.8 fL (ref 80.0–100.0)
Platelets: 408 10*3/uL — ABNORMAL HIGH (ref 150–400)
RBC: 4.54 MIL/uL (ref 3.87–5.11)
RDW: 14.4 % (ref 11.5–15.5)
WBC: 13.9 10*3/uL — ABNORMAL HIGH (ref 4.0–10.5)
nRBC: 0 % (ref 0.0–0.2)

## 2022-05-16 NOTE — ED Triage Notes (Signed)
Pt comes from home via POV c/o blisters on feet and hands. Pt reports she was here a few days ago with strep and was prescribed amoxicillin. Since then she has been having blisters on both hands and both feet. Pt reports blisters are painful. Denies CP, SOB, fevers, chills. Pt in NAD at this time.

## 2022-05-16 NOTE — ED Provider Triage Note (Signed)
Emergency Medicine Provider Triage Evaluation Note  Stephanie Ballard, a 36 y.o. female  was evaluated in triage.  Pt complains of blisters to the hands and feet. She was evaluated on 12/20 for strep with amoxil. She presents to the ED with development of pustules to the hands and feet.  She denies ongoing fevers or sore throat.   Review of Systems  Positive: Blisters to hands/feet Negative: FCS  Physical Exam  BP (!) 134/99 (BP Location: Left Arm)   Pulse 93   Temp 98.6 F (37 C) (Oral)   Resp 18   Ht 5' (1.524 m)   Wt 136.1 kg   LMP 05/06/2022 (Exact Date)   SpO2 97%   BMI 58.59 kg/m  Gen:   Awake, no distress  NAD Resp:  Normal effort  MSK:   Moves extremities without difficulty  Other:  Pustules to the palms and soles. No oral lesions noted  Medical Decision Making  Medically screening exam initiated at 7:54 PM.  Appropriate orders placed.  SHANNELL MIKKELSEN was informed that the remainder of the evaluation will be completed by another provider, this initial triage assessment does not replace that evaluation, and the importance of remaining in the ED until their evaluation is complete.  Patient to the ED for evaluation of development of pustules to the palms and soles.  She denies any other complaints at this time.  She denies any known drug allergies patient is currently taking amoxicillin for her strep pharyngitis.   Lissa Hoard, PA-C 05/16/22 1958

## 2022-05-17 ENCOUNTER — Emergency Department
Admission: EM | Admit: 2022-05-17 | Discharge: 2022-05-17 | Disposition: A | Discharge status: Home / Self Care | Payer: Self-pay | Attending: Emergency Medicine | Admitting: Emergency Medicine

## 2022-05-17 DIAGNOSIS — R21 Rash and other nonspecific skin eruption: Secondary | ICD-10-CM

## 2022-05-17 LAB — HSV 1/2 PCR (SURFACE)
HSV-1 DNA: NOT DETECTED
HSV-2 DNA: NOT DETECTED

## 2022-05-17 LAB — RPR: RPR Ser Ql: NONREACTIVE

## 2022-05-17 LAB — HIV ANTIBODY (ROUTINE TESTING W REFLEX): HIV Screen 4th Generation wRfx: NONREACTIVE

## 2022-05-17 LAB — MONONUCLEOSIS SCREEN: Mono Screen: NEGATIVE

## 2022-05-17 MED ORDER — PREDNISONE 10 MG (21) PO TBPK: ORAL_TABLET | ORAL | 0 refills | Status: DC

## 2022-05-17 MED ORDER — PREDNISONE 20 MG PO TABS
60.0000 mg | ORAL_TABLET | Freq: Once | ORAL | Status: AC
  Administered 2022-05-17: 60 mg via ORAL
  Filled 2022-05-17: qty 3

## 2022-05-17 NOTE — Discharge Instructions (Addendum)
I suspect that you have had a case of hand-foot-and-mouth that has been exacerbated by the topical salve that you have used and caused an allergic reaction called contact dermatitis.  If symptoms or not improving with over-the-counter Benadryl 50 mg every 8 hours as needed for itching and steroids, recommend close follow-up with dermatology.  We have sent further testing for HIV, syphilis and HSV.  You may follow-up on these test results through MyChart.  If any of these tests are positive, you will be contacted for further treatment and instructions.  It also seems unlikely that this was an allergic reaction to penicillin injection given it is just localized to your hands and feet.  If you notice any rash anywhere else, hives, itching, lip or tongue swelling, difficulty breathing, please return to the emergency department.

## 2022-05-17 NOTE — ED Provider Notes (Signed)
[facility] Provider Note    Event Date/Time   First MD Initiated Contact with Patient 05/17/22 [phone]     (approximate)   History   Blister   HPI  Stephanie Ballard is a 36 y.o. female with no significant past medical history who presents to the emergency department with pruritic blisterlike lesions to her hands and feet.  Was seen here on December 20 for sore throat and was diagnosed with strep pharyngitis and given an IM injection of penicillin.  She has taken penicillins before without reaction but is not sure if she has ever had IM medications.  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  She states that she used a salve that someone gave her at the nursing facility to put on her hands and feet and then the next day she had diffuse blisters.  No other rash anywhere else.  No lip or tongue swelling, chest pain or shortness of breath.  No fever.  No involvement of the lips, tongue or oropharynx.  She denies any other new exposures.  No hives.  She states the rash is mostly pruritic but not painful.  She has no history of herpes, syphilis.  There is around her genitalia.  She states when the blisters popped there is clear fluid in them.  There is no desquamation.  She denies any history of eczema.   History provided by patient.    History reviewed. No pertinent past medical history.  Past Surgical History:  Procedure Laterality Date   CESAREAN SECTION     cyst removed      MEDICATIONS:  Prior to Admission medications   Medication Sig Start Date End Date Taking? Authorizing Provider  amoxicillin (AMOXIL) 500 MG capsule Take 1 capsule (500 mg total) by mouth 2 (two) times daily for 10 days. 05/09/22 05/19/22  Nathaniel Man, MD  brompheniramine-pseudoephedrine-DM 30-2-10 MG/5ML syrup Take 5 mLs by mouth 4 (four) times daily as needed. 11/29/21    Johnn Hai, PA-C    Physical Exam   Triage Vital Signs: ED Triage Vitals  Enc Vitals Group     BP 05/16/22 1943 (!) 134/99     Pulse Rate 05/16/22 1943 93     Resp 05/16/22 1943 18     Temp 05/16/22 1943 98.6 F (37 C)     Temp Source 05/16/22 1943 Oral     SpO2 05/16/22 1943 97 %     Weight 05/16/22 1942 300 lb (136.1 kg)     Height 05/16/22 1942 5' (1.524 m)     Head Circumference --      Peak Flow --      Pain Score 05/16/22 1950 9     Pain Loc --      Pain Edu? --      Excl. in [place]? --     Most recent vital signs: Vitals:   05/16/22 2246 05/17/22 0155  BP: 124/85 (!) 125/97  Pulse: 80 80  Resp: 19 20  Temp:  97.8 F (36.6 C)  SpO2: 100% 100%    CONSTITUTIONAL: Alert and oriented and responds appropriately to questions. Well-appearing; well-nourished, extremely pleasant HEAD: Normocephalic, atraumatic EYES: Conjunctivae clear, pupils appear equal, sclera nonicteric ENT: normal nose; moist mucous membranes, mild bilateral tonsillar hypertrophy without exudate.  No other lesions noted in the oropharynx, lips or tongue.  Normal  phonation.  No trismus, stridor or drooling. NECK: Supple, normal ROM CARD: RRR; S1 and S2 appreciated; no murmurs, no clicks, no rubs, no gallops RESP: Normal chest excursion without splinting or tachypnea; breath sounds clear and equal bilaterally; no wheezes, no rhonchi, no rales, no hypoxia or respiratory distress, speaking full sentences ABD/GI: Normal bowel sounds; non-distended; soft, non-tender, no rebound, no guarding, no peritoneal signs BACK: The back appears normal EXT: Normal ROM in all joints; no deformity noted, no edema; no cyanosis SKIN: Normal color for age and race; warm; no urticaria.  Patient has small fluid-filled blisters noted mostly to the palms of both hands and the soles of both feet.  Seems to affect the hands more than the feet.  There is no desquamation.  No involvement of her mucous membranes.  No petechiae,  purpura.Marland Kitchen NEURO: Moves all extremities equally, normal speech PSYCH: The patient's mood and manner are appropriate.         Patient gave verbal permission to utilize photo for medical documentation only. The image was not stored on any personal device.  ED Results / Procedures / Treatments   LABS: (all labs ordered are listed, but only abnormal results are displayed) Labs Reviewed  CBC - Abnormal; Notable for the following components:      Result Value   WBC 13.9 (*)    Platelets 408 (*)    All other components within normal limits  BASIC METABOLIC PANEL  MONONUCLEOSIS SCREEN  RPR  HSV 1/2 PCR (SURFACE)  HIV ANTIBODY (ROUTINE TESTING W REFLEX)     EKG:   RADIOLOGY: My personal review and interpretation of imaging:    I have personally reviewed all radiology reports.   No results found.   PROCEDURES:  Critical Care performed: No     Procedures    IMPRESSION / MDM / ASSESSMENT AND PLAN / ED COURSE  I reviewed the triage vital signs and the nursing notes.    Patient here with fluid-filled blisters to her hands and feet that are pruritic.  No other systemic symptoms.  Otherwise well-appearing, afebrile.  No involvement of her mucous membranes.     DIFFERENTIAL DIAGNOSIS (includes but not limited to):   Low suspicion for SJS or TEN.  I doubt that these are herpetic lesions given that they are involved only on her hands and feet but will send PCR swab for HSV 1 and HSV 2.  Less likely syphilis.  Will check RPR, HIV.  Discussed with patient that given her initial description of the rash I suspect that this could have been hand-foot-and-mouth that then turned into a possible contact dermatitis given this unknown salve that she put on her hands and feet.  No signs of superimposed bacterial infection, cellulitis.   Patient's presentation is most consistent with acute complicated illness / injury requiring diagnostic workup.   PLAN: Workup initiated from  triage.  Patient does have a leukocytosis of 13,000.  Normal electrolytes and renal function.  Monospot negative.  Will send HIV, RPR, HSV swab.  She is otherwise extremely well-appearing here without fever, systemic symptoms or mucous membrane involvement.  No signs of proposed bacterial infection.  I suspect that this may be hand-foot-and-mouth worsened by the salve that she put on her and causing a contact dermatitis which is causing these blisters.  Will put her on a prednisone taper and recommended over-the-counter antihistamines.  No indication for antibiotics or antivirals at this time.  Will give dermatology follow-up information.  Patient is comfortable with  this plan.  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   MEDICATIONS GIVEN IN ED: Medications  predniSONE (DELTASONE) tablet 60 mg (60 mg Oral Given 05/17/22 0537)     ED COURSE:  At this time, I do not feel there is any life-threatening condition present. I reviewed all nursing notes, vitals, pertinent previous records.  All lab and urine results, EKGs, imaging ordered have been independently reviewed and interpreted by myself.  I reviewed all available radiology reports from any imaging ordered this visit.  Based on my assessment, I feel the patient is safe to be discharged home without further emergent workup and can continue workup as an outpatient as needed. Discussed all findings, treatment plan as well as usual and customary return precautions.  They verbalize understanding and are comfortable with this plan.  Outpatient follow-up has been provided as needed.  All questions have been answered.    CONSULTS:  none   OUTSIDE RECORDS REVIEWED: Reviewed patient's last hospitalization in January 2018.       FINAL CLINICAL IMPRESSION(S) / ED DIAGNOSES   Final diagnoses:  Rash     Rx / DC Orders   ED Discharge Orders          Ordered    predniSONE (STERAPRED UNI-PAK 21 TAB) 10 MG (21) TBPK tablet         05/17/22 8299             Note:  This document was prepared using Dragon voice recognition software and may include unintentional dictation errors.   Journi Moffa, Layla Maw, DO 05/17/22 [phone]

## 2022-05-17 NOTE — ED Notes (Signed)
No answer x3 when ca;lled for repeat vital signs 

## 2023-07-27 ENCOUNTER — Emergency Department: Payer: Self-pay

## 2023-07-27 ENCOUNTER — Emergency Department
Admission: EM | Admit: 2023-07-27 | Discharge: 2023-07-27 | Disposition: A | Discharge status: Home / Self Care | Payer: Self-pay | Attending: Emergency Medicine | Admitting: Emergency Medicine

## 2023-07-27 ENCOUNTER — Other Ambulatory Visit: Payer: Self-pay

## 2023-07-27 DIAGNOSIS — S82831A Other fracture of upper and lower end of right fibula, initial encounter for closed fracture: Secondary | ICD-10-CM

## 2023-07-27 DIAGNOSIS — M25561 Pain in right knee: Secondary | ICD-10-CM | POA: Diagnosis present

## 2023-07-27 DIAGNOSIS — S82141A Displaced bicondylar fracture of right tibia, initial encounter for closed fracture: Secondary | ICD-10-CM

## 2023-07-27 DIAGNOSIS — Y92410 Unspecified street and highway as the place of occurrence of the external cause: Secondary | ICD-10-CM | POA: Insufficient documentation

## 2023-07-27 MED ORDER — ONDANSETRON 4 MG PO TBDP
4.0000 mg | ORAL_TABLET | Freq: Once | ORAL | Status: AC
  Administered 2023-07-27: 4 mg via ORAL
  Filled 2023-07-27: qty 1

## 2023-07-27 MED ORDER — OXYCODONE-ACETAMINOPHEN 5-325 MG PO TABS: 1.0000 | ORAL_TABLET | Freq: Three times a day (TID) | ORAL | 0 refills | Status: AC | PRN

## 2023-07-27 MED ORDER — OXYCODONE-ACETAMINOPHEN 5-325 MG PO TABS
1.0000 | ORAL_TABLET | Freq: Once | ORAL | Status: AC
  Administered 2023-07-27: 1 via ORAL
  Filled 2023-07-27: qty 1

## 2023-07-27 MED ORDER — CYCLOBENZAPRINE HCL 10 MG PO TABS
10.0000 mg | ORAL_TABLET | Freq: Once | ORAL | Status: AC
  Administered 2023-07-27: 10 mg via ORAL
  Filled 2023-07-27: qty 1

## 2023-07-27 MED ORDER — CYCLOBENZAPRINE HCL 5 MG PO TABS: 5.0000 mg | ORAL_TABLET | Freq: Three times a day (TID) | ORAL | 0 refills | Status: AC | PRN

## 2023-07-27 MED ORDER — KETOROLAC TROMETHAMINE 30 MG/ML IJ SOLN
30.0000 mg | Freq: Once | INTRAMUSCULAR | Status: AC
  Administered 2023-07-27: 30 mg via INTRAMUSCULAR
  Filled 2023-07-27: qty 1

## 2023-07-27 MED ORDER — ONDANSETRON 4 MG PO TBDP: 4.0000 mg | ORAL_TABLET | Freq: Three times a day (TID) | ORAL | 0 refills | Status: AC | PRN
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
## 2023-07-27 NOTE — ED Notes (Signed)
 Immobilizer given to pt along with walker. Pt waiting on her ride to come and states they're on the way.
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
## 2023-07-27 NOTE — Discharge Instructions (Signed)
 Your exam, x-rays, and CT scan confirm injury to your right leg.  You have a tibial plateau fracture as well as a fibula head fracture on the right knee.  This injury is treated with immobilization and nonweightbearing.  Wear the knee brace as directed. Use the walker to ambulate without putting weight on your right leg.  Rest with the leg elevated and apply ice to help reduce swelling and pain.  Take prescription meds as directed.  Follow-up with Dr. Audelia Acton next week for reevaluation and discussion about further management.
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
